# Patient Record
Sex: Female | Born: 1990 | Race: White | Hispanic: No | Marital: Married | Smoking: Never smoker
Health system: Southern US, Community
[De-identification: ages and names within clinical notes are randomized; demographics above are authoritative.]

## PROBLEM LIST (undated history)

## (undated) DIAGNOSIS — B977 Papillomavirus as the cause of diseases classified elsewhere: Secondary | ICD-10-CM

## (undated) DIAGNOSIS — O139 Gestational [pregnancy-induced] hypertension without significant proteinuria, unspecified trimester: Secondary | ICD-10-CM

## (undated) DIAGNOSIS — L309 Dermatitis, unspecified: Secondary | ICD-10-CM

## (undated) DIAGNOSIS — R87629 Unspecified abnormal cytological findings in specimens from vagina: Secondary | ICD-10-CM

## (undated) HISTORY — DX: Papillomavirus as the cause of diseases classified elsewhere: B97.7

## (undated) HISTORY — DX: Unspecified abnormal cytological findings in specimens from vagina: R87.629

## (undated) HISTORY — DX: Gestational (pregnancy-induced) hypertension without significant proteinuria, unspecified trimester: O13.9

---
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
## 2020-01-23 ENCOUNTER — Encounter: Payer: Self-pay | Admitting: Certified Nurse Midwife

## 2020-02-13 ENCOUNTER — Encounter: Payer: Self-pay | Admitting: Advanced Practice Midwife

## 2020-02-13 ENCOUNTER — Other Ambulatory Visit (HOSPITAL_COMMUNITY)
Admission: RE | Admit: 2020-02-13 | Discharge: 2020-02-13 | Disposition: A | Discharge status: Home / Self Care | Payer: 59 | Source: Ambulatory Visit | Attending: Advanced Practice Midwife | Admitting: Advanced Practice Midwife

## 2020-02-13 ENCOUNTER — Ambulatory Visit (INDEPENDENT_AMBULATORY_CARE_PROVIDER_SITE_OTHER): Payer: 59 | Admitting: Advanced Practice Midwife

## 2020-02-13 ENCOUNTER — Other Ambulatory Visit: Payer: Self-pay

## 2020-02-13 VITALS — BP 125/77 | HR 100 | Wt 208.0 lb

## 2020-02-13 DIAGNOSIS — Z3490 Encounter for supervision of normal pregnancy, unspecified, unspecified trimester: Secondary | ICD-10-CM

## 2020-02-13 DIAGNOSIS — O219 Vomiting of pregnancy, unspecified: Secondary | ICD-10-CM

## 2020-02-13 DIAGNOSIS — Z8759 Personal history of other complications of pregnancy, childbirth and the puerperium: Secondary | ICD-10-CM

## 2020-02-13 DIAGNOSIS — Z3A12 12 weeks gestation of pregnancy: Secondary | ICD-10-CM

## 2020-02-13 NOTE — Progress Notes (Signed)
Bedside U/S shows single IUP with FHT of 164 BPM and HC measures 7.51cm  GA is [redacted]w[redacted]d

## 2020-02-13 NOTE — Progress Notes (Signed)
Subjective:   Kathryn Mccoy is a 29 y.o. G2P1001 at [redacted]w[redacted]d by LMP being seen today for her first obstetrical visit.  Her obstetrical history is significant for HTN with first pregnancy, problems with epidural and postpartum hemorrhage and has Supervision of normal pregnancy on their problem list.. Patient does intend to breast feed. She is currently breastfeeding her 66 year old daughter.  Pregnancy history fully reviewed.  Patient reports nausea.  HISTORY: OB History  Gravida Para Term Preterm AB Living  2 1 1  0 0 1  SAB TAB Ectopic Multiple Live Births  0 0 0 0 1    # Outcome Date GA Lbr Len/2nd Weight Sex Delivery Anes PTL Lv  2 Current           1 Term 11/11/17 [redacted]w[redacted]d          Past Medical History:  Diagnosis Date  . Eczema   . Gestational hypertension   . HPV in female   . Vaginal Pap smear, abnormal    No past surgical history on file. Family History  Problem Relation Age of Onset  . Hypertension Father   . Breast cancer Mother 25   Social History   Tobacco Use  . Smoking status: Never Smoker  . Smokeless tobacco: Never Used  Vaping Use  . Vaping Use: Former  . Substances: Nicotine  Substance Use Topics  . Alcohol use: Never  . Drug use: Never   No Known Allergies Current Outpatient Medications on File Prior to Visit  Medication Sig Dispense Refill  . Prenatal Vit-Fe Fumarate-FA (PRENATAL VITAMINS PO) Take 1 tablet by mouth daily.     No current facility-administered medications on file prior to visit.    Indications for ASA therapy (per uptodate) One of the following: Previous pregnancy with preeclampsia, especially early onset and with an adverse outcome No Multifetal gestation No Chronic hypertension No Type 1 or 2 diabetes mellitus No Chronic kidney disease No Autoimmune disease (antiphospholipid syndrome, systemic lupus erythematosus) No   Two or more of the following: Nulliparity No Obesity (body mass index >30 kg/m2) Yes Family history of  preeclampsia in mother or sister No Age ?35 years No Sociodemographic characteristics (African American race, low socioeconomic level) No Personal risk factors (eg, previous pregnancy with low birth weight or small for gestational age infant, previous adverse pregnancy outcome [eg, stillbirth], interval >10 years between pregnancies) No   Indications for early 1 hour GTT (per uptodate)  BMI >25 (>23 in Asian women) AND one of the following  Gestational diabetes mellitus in a previous pregnancy No Glycated hemoglobin ?5.7 percent (39 mmol/mol), impaired glucose tolerance, or impaired fasting glucose on previous testing No First-degree relative with diabetes No High-risk race/ethnicity (eg, African American, Latino, Native American, 41 American, Pacific Islander) No History of cardiovascular disease No Hypertension or on therapy for hypertension No High-density lipoprotein cholesterol level <35 mg/dL ([place] mmol/L) and/or a triglyceride level >250 mg/dL (4.81 mmol/L) No Polycystic ovary syndrome No Physical inactivity No Other clinical condition associated with insulin resistance (eg, severe obesity, acanthosis nigricans) No Previous birth of an infant weighing ?4000 g No Previous stillbirth of unknown cause No Exam   Vitals:   02/13/20 1451  BP: 125/77  Pulse: 100  Weight: 208 lb (94.3 kg)   Fetal Heart Rate (bpm): 164  Uterus:     Pelvic Exam: Perineum: no hemorrhoids, normal perineum   Vulva: normal external genitalia, no lesions   Vagina:  normal mucosa, normal discharge  Cervix: no lesions and normal, pap smear done.    Adnexa: normal adnexa and no mass, fullness, tenderness   Bony Pelvis: average  System: General: well-developed, well-nourished female in no acute distress   Breast:  normal appearance, no masses or tenderness   Skin: normal coloration and turgor, no rashes   Neurologic: oriented, normal, negative, normal mood   Extremities: normal strength, tone, and  muscle mass, ROM of all joints is normal   HEENT PERRLA, extraocular movement intact and sclera clear, anicteric   Mouth/Teeth mucous membranes moist, pharynx normal without lesions and dental hygiene good   Neck supple and no masses   Cardiovascular: regular rate and rhythm   Respiratory:  no respiratory distress, normal breath sounds   Abdomen: soft, non-tender; bowel sounds normal; no masses,  no organomegaly     Assessment:   Pregnancy: G2P1001 Patient Active Problem List   Diagnosis Date Noted  . Supervision of normal pregnancy 02/13/2020     Plan:   1. [redacted] weeks gestation of pregnancy   2. Encounter for supervision of normal pregnancy, antepartum, unspecified gravidity --Anticipatory guidance about next visits/weeks of pregnancy given. --Next visit in 4 weeks in person for AFP  - Obstetric panel - HIV antibody (with reflex) - Hepatitis C Antibody - Culture, OB Urine - Hemoglobinopathy Evaluation - [place] bedside; Future - Genetic Screening - Babyscripts Schedule Optimization - GC/Chlamydia probe amp ([place])not at [facility] - [place] MFM OB COMP + 14 WK; Future  3. History of gestational hypertension --Start Baby ASA daily to prevent PEC - Comprehensive metabolic panel - Protein / creatinine ratio, urine  4. History of postpartum hemorrhage --Pt "lost as much blood as in a c-section" the pt reports she was told by her doctor. Records requested. --Discussed options to prevent including medications like TXA and Cytotec in addition to Pitocin given during and after delivery.   5. Nausea and vomiting during pregnancy prior to [redacted] weeks gestation --Mostly nausea, rare vomiting --Discussed dietary changes, offered medications, pt declined at this time --Will try B6 and let office know if she needs more.     Initial labs drawn. Continue prenatal vitamins. Discussed and offered genetic screening options, including Quad screen/AFP, NIPS testing, and option to decline testing.  Benefits/risks/alternatives reviewed. Pt aware that anatomy US is form of genetic screening with lower accuracy in detecting trisomies than blood work.  Pt chooses genetic screening today. NIPS: ordered. Ultrasound discussed; fetal anatomic survey: ordered. Problem list reviewed and updated. The nature of [facility] with multiple MDs and other Advanced Practice Providers was explained to patient; also emphasized that residents, students are part of our team. Routine obstetric precautions reviewed. Return in about 6 weeks (around 03/26/2020).   Sharen Counter, CNM 02/13/20 4:36 PM

## 2020-02-13 NOTE — Progress Notes (Signed)
Last pap 6 months ago WNL @ [facility]

## 2020-02-13 NOTE — Patient Instructions (Signed)
General Headache Without Cause A headache is pain or discomfort felt around the head or neck area. The specific cause of a headache may not be found. There are many causes and types of headaches. A few common ones are:  Tension headaches.  Migraine headaches.  Cluster headaches.  Chronic daily headaches. Follow these instructions at home: Watch your condition for any changes. Let your health care provider know about them. Take these steps to help with your condition: Managing pain      Take over-the-counter and prescription medicines only as told by your health care provider.  Lie down in a dark, quiet room when you have a headache.  If directed, put ice on your head and neck area: ? Put ice in a plastic bag. ? Place a towel between your skin and the bag. ? Leave the ice on for 20 minutes, 2-3 times per day.  If directed, apply heat to the affected area. Use the heat source that your health care provider recommends, such as a moist heat pack or a heating pad. ? Place a towel between your skin and the heat source. ? Leave the heat on for 20-30 minutes. ? Remove the heat if your skin turns bright red. This is especially important if you are unable to feel pain, heat, or cold. You may have a greater risk of getting burned.  Keep lights dim if bright lights bother you or make your headaches worse. Eating and drinking  Eat meals on a regular schedule.  If you drink alcohol: ? Limit how much you use to:  0-1 drink a day for women.  0-2 drinks a day for men. ? Be aware of how much alcohol is in your drink. In the [place] one drink equals one 12 oz bottle of beer (355 mL), one 5 oz glass of wine (148 mL), or one 1 oz glass of hard liquor (44 mL).  Stop drinking caffeine, or decrease the amount of caffeine you drink. General instructions   Keep a headache journal to help find out what may trigger your headaches. For example, write down: ? What you eat and drink. ? How much  sleep you get. ? Any change to your diet or medicines.  Try massage or other relaxation techniques.  Limit stress.  Sit up straight, and do not tense your muscles.  Do not use any products that contain nicotine or tobacco, such as cigarettes, e-cigarettes, and chewing tobacco. If you need help quitting, ask your health care provider.  Exercise regularly as told by your health care provider.  Sleep on a regular schedule. Get 7-9 hours of sleep each night, or the amount recommended by your health care provider.  Keep all follow-up visits as told by your health care provider. This is important. Contact a health care provider if:  Your symptoms are not helped by medicine.  You have a headache that is different from the usual headache.  You have nausea or you vomit.  You have a fever. Get help right away if:  Your headache becomes severe quickly.  Your headache gets worse after moderate to intense physical activity.  You have repeated vomiting.  You have a stiff neck.  You have a loss of vision.  You have problems with speech.  You have pain in the eye or ear.  You have muscular weakness or loss of muscle control.  You lose your balance or have trouble walking.  You feel faint or pass out.  You have confusion.    You have a seizure. Summary  A headache is pain or discomfort felt around the head or neck area.  There are many causes and types of headaches. In some cases, the cause may not be found.  Keep a headache journal to help find out what may trigger your headaches. Watch your condition for any changes. Let your health care provider know about them.  Contact a health care provider if you have a headache that is different from the usual headache, or if your symptoms are not helped by medicine.  Get help right away if your headache becomes severe, you vomit, you have a loss of vision, you lose your balance, or you have a seizure. This information is not  intended to replace advice given to you by your health care provider. Make sure you discuss any questions you have with your health care provider. Document Revised: 12/06/2017 Document Reviewed: 12/06/2017 Elsevier Patient Education  2020 Elsevier Inc.  

## 2020-02-14 LAB — GC/CHLAMYDIA PROBE AMP (~~LOC~~) NOT AT ARMC
Chlamydia: NEGATIVE
Comment: NEGATIVE
Comment: NORMAL
Neisseria Gonorrhea: NEGATIVE

## 2020-02-15 LAB — OBSTETRIC PANEL
Absolute Monocytes: 353 cells/uL (ref 200–950)
Antibody Screen: NOT DETECTED
Basophils Absolute: 30 cells/uL (ref 0–200)
Basophils Relative: 0.4 %
Eosinophils Absolute: 83 cells/uL (ref 15–500)
Eosinophils Relative: 1.1 %
HCT: 37.6 % (ref 35.0–45.0)
Hemoglobin: 12.5 g/dL (ref 11.7–15.5)
Hepatitis B Surface Ag: NONREACTIVE
Lymphs Abs: 1725 cells/uL (ref 850–3900)
MCH: 28.5 pg (ref 27.0–33.0)
MCHC: 33.2 g/dL (ref 32.0–36.0)
MCV: 85.6 fL (ref 80.0–100.0)
MPV: 10.9 fL (ref 7.5–12.5)
Monocytes Relative: 4.7 %
Neutro Abs: 5310 cells/uL (ref 1500–7800)
Neutrophils Relative %: 70.8 %
Platelets: 246 10*3/uL (ref 140–400)
RBC: 4.39 10*6/uL (ref 3.80–5.10)
RDW: 13.7 % (ref 11.0–15.0)
RPR Ser Ql: NONREACTIVE
Rubella: 0.96 Index — ABNORMAL LOW
Total Lymphocyte: 23 %
WBC: 7.5 10*3/uL (ref 3.8–10.8)

## 2020-02-15 LAB — PROTEIN / CREATININE RATIO, URINE
Creatinine, Urine: 74 mg/dL (ref 20–275)
Protein/Creat Ratio: 108 mg/g creat (ref 21–161)
Protein/Creatinine Ratio: 0.108 mg/mg creat (ref 0.021–0.16)
Total Protein, Urine: 8 mg/dL (ref 5–24)

## 2020-02-15 LAB — COMPREHENSIVE METABOLIC PANEL
AG Ratio: 1.3 (calc) (ref 1.0–2.5)
ALT: 13 U/L (ref 6–29)
AST: 14 U/L (ref 10–30)
Albumin: 4 g/dL (ref 3.6–5.1)
Alkaline phosphatase (APISO): 66 U/L (ref 31–125)
BUN/Creatinine Ratio: 12 (calc) (ref 6–22)
BUN: 6 mg/dL — ABNORMAL LOW (ref 7–25)
CO2: 21 mmol/L (ref 20–32)
Calcium: 9.5 mg/dL (ref 8.6–10.2)
Chloride: 102 mmol/L (ref 98–110)
Creat: 0.49 mg/dL — ABNORMAL LOW (ref 0.50–1.10)
Globulin: 3.1 g/dL (calc) (ref 1.9–3.7)
Glucose, Bld: 82 mg/dL (ref 65–99)
Potassium: 3.7 mmol/L (ref 3.5–5.3)
Sodium: 135 mmol/L (ref 135–146)
Total Bilirubin: 0.3 mg/dL (ref 0.2–1.2)
Total Protein: 7.1 g/dL (ref 6.1–8.1)

## 2020-02-15 LAB — HEMOGLOBINOPATHY EVALUATION
Fetal Hemoglobin Testing: <1 % (ref 0.0–1.9)
HCT: 38.4 % (ref 35.0–45.0)
Hemoglobin A2 - HGBRFX: 2.5 % (ref 2.2–3.2)
Hemoglobin: 12.3 g/dL (ref 11.7–15.5)
Hgb A: 97.5 % (ref >96.0)
MCH: 27.5 pg (ref 27.0–33.0)
MCV: 85.9 fL (ref 80.0–100.0)
RBC: 4.47 10*6/uL (ref 3.80–5.10)
RDW: 13.8 % (ref 11.0–15.0)

## 2020-02-15 LAB — URINE CULTURE, OB REFLEX: Organism ID, Bacteria: NO GROWTH

## 2020-02-15 LAB — CULTURE, OB URINE

## 2020-02-15 LAB — HIV ANTIBODY (ROUTINE TESTING W REFLEX): HIV 1&2 Ab, 4th Generation: NONREACTIVE

## 2020-02-15 LAB — HEPATITIS C ANTIBODY
Hepatitis C Ab: NONREACTIVE
SIGNAL TO CUT-OFF: 0.06 (ref <1.00)

## 2020-02-26 ENCOUNTER — Encounter: Payer: Self-pay | Admitting: *Deleted

## 2020-02-26 DIAGNOSIS — Z3490 Encounter for supervision of normal pregnancy, unspecified, unspecified trimester: Secondary | ICD-10-CM

## 2020-02-27 ENCOUNTER — Encounter: Payer: Self-pay | Admitting: *Deleted

## 2020-02-27 DIAGNOSIS — Z3490 Encounter for supervision of normal pregnancy, unspecified, unspecified trimester: Secondary | ICD-10-CM

## 2020-03-12 ENCOUNTER — Telehealth (INDEPENDENT_AMBULATORY_CARE_PROVIDER_SITE_OTHER): Payer: 59 | Admitting: Advanced Practice Midwife

## 2020-03-12 VITALS — BP 113/71 | HR 96 | Wt 209.0 lb

## 2020-03-12 DIAGNOSIS — Z3492 Encounter for supervision of normal pregnancy, unspecified, second trimester: Secondary | ICD-10-CM

## 2020-03-12 DIAGNOSIS — Z3490 Encounter for supervision of normal pregnancy, unspecified, unspecified trimester: Secondary | ICD-10-CM

## 2020-03-12 DIAGNOSIS — Z3A16 16 weeks gestation of pregnancy: Secondary | ICD-10-CM

## 2020-03-12 NOTE — Progress Notes (Signed)
   OBSTETRICS PRENATAL VIRTUAL VISIT ENCOUNTER NOTE  Provider location: Center for [facility] [place]   I connected with [name] on 03/12/20 at  9:50 AM EDT by MyChart Video Encounter at home and verified that I am speaking with the correct person using two identifiers.   I discussed the limitations, risks, security and privacy concerns of performing an evaluation and management service virtually and the availability of in person appointments. I also discussed with the patient that there may be a patient responsible charge related to this service. The patient expressed understanding and agreed to proceed. Subjective:  Kathryn Mccoy is a 29 y.o. G2P1001 at [redacted]w[redacted]d being seen today for ongoing prenatal care.  She is currently monitored for the following issues for this low-risk pregnancy and has Supervision of normal pregnancy; History of postpartum hemorrhage; and History of gestational hypertension on their problem list.  Patient reports no complaints.  Contractions: Not present. Vag. Bleeding: None.  Movement: Present. Denies any leaking of fluid.   The following portions of the patient's history were reviewed and updated as appropriate: allergies, current medications, past family history, past medical history, past social history, past surgical history and problem list.   Objective:   Vitals:   03/12/20 0850  BP: 113/71  Pulse: 96  Weight: 209 lb (94.8 kg)    Fetal Status:     Movement: Present     General:  Alert, oriented and cooperative. Patient is in no acute distress.  Respiratory: Normal respiratory effort, no problems with respiration noted  Mental Status: Normal mood and affect. Normal behavior. Normal judgment and thought content.  Rest of physical exam deferred due to type of encounter  Imaging: No results found.  Assessment and Plan:  Pregnancy: G2P1001 at [redacted]w[redacted]d 1. Encounter for supervision of normal pregnancy, antepartum, unspecified  gravidity --Anticipatory guidance about next visits/weeks of pregnancy given. --Next visit in 4 weeks, in office for AFP --Anatomy US next month  2. [redacted] weeks gestation of pregnancy   Preterm labor symptoms and general obstetric precautions including but not limited to vaginal bleeding, contractions, leaking of fluid and fetal movement were reviewed in detail with the patient. I discussed the assessment and treatment plan with the patient. The patient was provided an opportunity to ask questions and all were answered. The patient agreed with the plan and demonstrated an understanding of the instructions. The patient was advised to call back or seek an in-person office evaluation/go to [facility] at [facility] for any urgent or concerning symptoms. Please refer to After Visit Summary for other counseling recommendations.   I provided 7 minutes of face-to-face time during this encounter.  Return in about 4 weeks (around 04/09/2020).  Future Appointments  Date Time Provider Department Center  04/02/2020  2:45 PM [facility] US4 [facility]    Sharen Counter, CNM Center for [employer], [facility]

## 2020-04-02 ENCOUNTER — Ambulatory Visit: Payer: 59 | Attending: Advanced Practice Midwife

## 2020-04-02 ENCOUNTER — Other Ambulatory Visit: Payer: Self-pay

## 2020-04-02 DIAGNOSIS — Z3490 Encounter for supervision of normal pregnancy, unspecified, unspecified trimester: Secondary | ICD-10-CM | POA: Insufficient documentation

## 2020-04-03 ENCOUNTER — Other Ambulatory Visit: Payer: Self-pay | Admitting: *Deleted

## 2020-04-03 DIAGNOSIS — Z8759 Personal history of other complications of pregnancy, childbirth and the puerperium: Secondary | ICD-10-CM

## 2020-04-09 ENCOUNTER — Ambulatory Visit (INDEPENDENT_AMBULATORY_CARE_PROVIDER_SITE_OTHER): Payer: 59 | Admitting: Advanced Practice Midwife

## 2020-04-09 ENCOUNTER — Other Ambulatory Visit: Payer: Self-pay

## 2020-04-09 VITALS — BP 117/71 | HR 95 | Wt 211.0 lb

## 2020-04-09 DIAGNOSIS — Z3A20 20 weeks gestation of pregnancy: Secondary | ICD-10-CM

## 2020-04-09 DIAGNOSIS — O43199 Other malformation of placenta, unspecified trimester: Secondary | ICD-10-CM

## 2020-04-09 DIAGNOSIS — Z3490 Encounter for supervision of normal pregnancy, unspecified, unspecified trimester: Secondary | ICD-10-CM

## 2020-04-09 NOTE — Progress Notes (Signed)
   PRENATAL VISIT NOTE  Subjective:  Kathryn Mccoy is a 29 y.o. G2P1001 at [redacted]w[redacted]d being seen today for ongoing prenatal care.  She is currently monitored for the following issues for this low-risk pregnancy and has Supervision of normal pregnancy; History of postpartum hemorrhage; History of gestational hypertension; and Marginal insertion of umbilical cord affecting management of mother on their problem list.  Patient reports no complaints.  Contractions: Not present. Vag. Bleeding: None.  Movement: Present. Denies leaking of fluid.   The following portions of the patient's history were reviewed and updated as appropriate: allergies, current medications, past family history, past medical history, past social history, past surgical history and problem list.   Objective:   Vitals:   04/09/20 1544  BP: 117/71  Pulse: 95  Weight: 211 lb (95.7 kg)    Fetal Status: Fetal Heart Rate (bpm): 146   Movement: Present     General:  Alert, oriented and cooperative. Patient is in no acute distress.  Skin: Skin is warm and dry. No rash noted.   Cardiovascular: Normal heart rate noted  Respiratory: Normal respiratory effort, no problems with respiration noted  Abdomen: Soft, gravid, appropriate for gestational age.  Pain/Pressure: Absent     Pelvic: Cervical exam deferred        Extremities: Normal range of motion.  Edema: None  Mental Status: Normal mood and affect. Normal behavior. Normal judgment and thought content.   Assessment and Plan:  Pregnancy: G2P1001 at [redacted]w[redacted]d 1. [redacted] weeks gestation of pregnancy  - Alpha fetoprotein, maternal  2. Marginal insertion of umbilical cord affecting management of mother --Serial growth [place]  3. Encounter for supervision of normal pregnancy, antepartum, unspecified gravidity --Anticipatory guidance about next visits/weeks of pregnancy given. --Next visit in 4 weeks virtual, then in 8 weeks for GTT  Preterm labor symptoms and general obstetric precautions  including but not limited to vaginal bleeding, contractions, leaking of fluid and fetal movement were reviewed in detail with the patient. Please refer to After Visit Summary for other counseling recommendations.   No follow-ups on file.  Future Appointments  Date Time Provider Department Center  04/30/2020  3:30 PM [facility] NURSE [facility]  04/30/2020  3:45 PM [facility] US4 [facility]    Sharen Counter, CNM

## 2020-04-09 NOTE — Patient Instructions (Signed)

## 2020-04-10 LAB — ALPHA FETOPROTEIN, MATERNAL
AFP MoM: 0.9
AFP, Serum: 47.5 ng/mL
Calc'd Gestational Age: 20.9 weeks
Maternal Wt: 209 [lb_av]
Risk for ONTD: 1
Twins-AFP: 1

## 2020-04-30 ENCOUNTER — Encounter: Payer: Self-pay | Admitting: *Deleted

## 2020-04-30 ENCOUNTER — Telehealth: Payer: Self-pay

## 2020-04-30 ENCOUNTER — Other Ambulatory Visit: Payer: Self-pay

## 2020-04-30 ENCOUNTER — Ambulatory Visit: Payer: 59 | Attending: Obstetrics and Gynecology

## 2020-04-30 ENCOUNTER — Ambulatory Visit: Payer: 59 | Admitting: *Deleted

## 2020-04-30 DIAGNOSIS — E669 Obesity, unspecified: Secondary | ICD-10-CM | POA: Diagnosis not present

## 2020-04-30 DIAGNOSIS — O99212 Obesity complicating pregnancy, second trimester: Secondary | ICD-10-CM | POA: Diagnosis not present

## 2020-04-30 DIAGNOSIS — O09292 Supervision of pregnancy with other poor reproductive or obstetric history, second trimester: Secondary | ICD-10-CM

## 2020-04-30 DIAGNOSIS — Z8759 Personal history of other complications of pregnancy, childbirth and the puerperium: Secondary | ICD-10-CM | POA: Insufficient documentation

## 2020-04-30 DIAGNOSIS — O43199 Other malformation of placenta, unspecified trimester: Secondary | ICD-10-CM

## 2020-04-30 DIAGNOSIS — Z363 Encounter for antenatal screening for malformations: Secondary | ICD-10-CM | POA: Diagnosis not present

## 2020-04-30 DIAGNOSIS — Z3A23 23 weeks gestation of pregnancy: Secondary | ICD-10-CM

## 2020-04-30 NOTE — Telephone Encounter (Signed)
Per Dr. Parke Poisson, I changed this patients appt from 4wks out to 3wks out. Spoke with the patient and gave her a new appointment time.

## 2020-05-01 ENCOUNTER — Other Ambulatory Visit: Payer: Self-pay | Admitting: *Deleted

## 2020-05-07 ENCOUNTER — Telehealth (INDEPENDENT_AMBULATORY_CARE_PROVIDER_SITE_OTHER): Payer: 59 | Admitting: Advanced Practice Midwife

## 2020-05-07 ENCOUNTER — Other Ambulatory Visit: Payer: Self-pay

## 2020-05-07 DIAGNOSIS — O98519 Other viral diseases complicating pregnancy, unspecified trimester: Secondary | ICD-10-CM

## 2020-05-07 DIAGNOSIS — O365920 Maternal care for other known or suspected poor fetal growth, second trimester, not applicable or unspecified: Secondary | ICD-10-CM | POA: Insufficient documentation

## 2020-05-07 DIAGNOSIS — U071 Other viral diseases complicating pregnancy, unspecified trimester: Secondary | ICD-10-CM

## 2020-05-07 DIAGNOSIS — Z3A24 24 weeks gestation of pregnancy: Secondary | ICD-10-CM

## 2020-05-07 DIAGNOSIS — O43199 Other malformation of placenta, unspecified trimester: Secondary | ICD-10-CM

## 2020-05-07 DIAGNOSIS — Z3490 Encounter for supervision of normal pregnancy, unspecified, unspecified trimester: Secondary | ICD-10-CM

## 2020-05-07 DIAGNOSIS — O43192 Other malformation of placenta, second trimester: Secondary | ICD-10-CM

## 2020-05-07 DIAGNOSIS — O98512 Other viral diseases complicating pregnancy, second trimester: Secondary | ICD-10-CM

## 2020-05-07 HISTORY — DX: Other viral diseases complicating pregnancy, unspecified trimester: U07.1

## 2020-05-07 HISTORY — DX: Maternal care for other known or suspected poor fetal growth, second trimester, not applicable or unspecified: O36.5920

## 2020-05-07 HISTORY — DX: COVID-19: O98.519

## 2020-05-07 NOTE — Progress Notes (Signed)
   TELEHEALTH OBSTETRICS VISIT ENCOUNTER NOTE  I connected with Kathryn Mccoy on 05/07/20 at  9:30 AM EST by telephone at home and verified that I am speaking with the correct person using two identifiers. The patient was unable to connect to MyChart for video visit so proceeded with visit by telephone.  I discussed the limitations, risks, security and privacy concerns of performing an evaluation and management service by telephone and the availability of in person appointments. I also discussed with the patient that there may be a patient responsible charge related to this service. The patient expressed understanding and agreed to proceed.  Subjective:  Kathryn Mccoy is a 29 y.o. G2P1001 at [redacted]w[redacted]d being followed for ongoing prenatal care.  She is currently monitored for the following issues for this low-risk pregnancy and has Supervision of normal pregnancy; History of postpartum hemorrhage; History of gestational hypertension; and Marginal insertion of umbilical cord affecting management of mother on their problem list.  Patient reports no complaints. Reports fetal movement. Denies any contractions, bleeding or leaking of fluid.   The following portions of the patient's history were reviewed and updated as appropriate: allergies, current medications, past family history, past medical history, past social history, past surgical history and problem list.   Objective:   General:  Alert, oriented and cooperative.   Mental Status: Normal mood and affect perceived. Normal judgment and thought content.  Rest of physical exam deferred due to type of encounter  Assessment and Plan:  Pregnancy: G2P1001 at [redacted]w[redacted]d 1. [redacted] weeks gestation of pregnancy   2. Marginal insertion of umbilical cord affecting management of mother --Growth  [place] Q 4 weeks, see below  3. Encounter for supervision of normal pregnancy, antepartum, unspecified gravidity --Pt reports good fetal movement, denies cramping, LOF, or  vaginal bleeding --Anticipatory guidance about next visits/weeks of pregnancy given. --Next visit in office in 4 weeks for GTT  4. Poor fetal growth affecting management of mother in second trimester, single or unspecified fetus --[place] on 11/30 showed EFW 7%tile, follow up in 3 weeks  5. COVID-19 affecting pregnancy, antepartum --Pt had COVID in early pregnancy, lost 5 lbs but is now back to weight before COVID and no residual COVID symptoms.  --Growth US shows SGA so will continue growth US/dopplers with MFM --Discussed possible need for IOL at 39 weeks or sooner depending on growth and dopplers. Pt states understanding.   Preterm labor symptoms and general obstetric precautions including but not limited to vaginal bleeding, contractions, leaking of fluid and fetal movement were reviewed in detail with the patient.  I discussed the assessment and treatment plan with the patient. The patient was provided an opportunity to ask questions and all were answered. The patient agreed with the plan and demonstrated an understanding of the instructions. The patient was advised to call back or seek an in-person office evaluation/go to MAU at [facility] for any urgent or concerning symptoms. Please refer to After Visit Summary for other counseling recommendations.   I provided 10 minutes of non-face-to-face time during this encounter.  No follow-ups on file.  Future Appointments  Date Time Provider Department Center  05/22/2020  8:30 AM [facility] NURSE [facility]  05/22/2020  8:45 AM [facility] US4 [facility]    Sharen Counter, CNM Center for [employer], [facility]

## 2020-05-22 ENCOUNTER — Ambulatory Visit: Payer: 59 | Admitting: *Deleted

## 2020-05-22 ENCOUNTER — Other Ambulatory Visit: Payer: Self-pay | Admitting: *Deleted

## 2020-05-22 ENCOUNTER — Ambulatory Visit: Payer: 59 | Attending: Obstetrics and Gynecology

## 2020-05-22 ENCOUNTER — Other Ambulatory Visit: Payer: Self-pay

## 2020-05-22 ENCOUNTER — Encounter: Payer: Self-pay | Admitting: *Deleted

## 2020-05-22 DIAGNOSIS — O43199 Other malformation of placenta, unspecified trimester: Secondary | ICD-10-CM | POA: Insufficient documentation

## 2020-05-22 DIAGNOSIS — E669 Obesity, unspecified: Secondary | ICD-10-CM

## 2020-05-22 DIAGNOSIS — Z3A27 27 weeks gestation of pregnancy: Secondary | ICD-10-CM

## 2020-05-22 DIAGNOSIS — O2692 Pregnancy related conditions, unspecified, second trimester: Secondary | ICD-10-CM

## 2020-05-22 DIAGNOSIS — O09292 Supervision of pregnancy with other poor reproductive or obstetric history, second trimester: Secondary | ICD-10-CM | POA: Diagnosis not present

## 2020-05-22 DIAGNOSIS — O99212 Obesity complicating pregnancy, second trimester: Secondary | ICD-10-CM

## 2020-05-22 DIAGNOSIS — O289 Unspecified abnormal findings on antenatal screening of mother: Secondary | ICD-10-CM

## 2020-05-22 DIAGNOSIS — Z8759 Personal history of other complications of pregnancy, childbirth and the puerperium: Secondary | ICD-10-CM

## 2020-05-22 DIAGNOSIS — Z363 Encounter for antenatal screening for malformations: Secondary | ICD-10-CM

## 2020-05-28 ENCOUNTER — Ambulatory Visit: Payer: 59

## 2020-06-01 NOTE — L&D Delivery Note (Signed)
Delivery Note Called to room and patient complete and pushing. At 5:32 AM a viable female was delivered via Vaginal, Spontaneous (Presentation: Right Occiput Anterior).  APGAR:1,3,5; weight 7 lb 5.5 oz (3330 g).  Tight nuchal cord x1 noted at delivery, delivered through. Placenta status: Pathology;Manual removal, Intact.  Cord: 3 vessels with the following complications: velamentous insertion.  Cord pH: pending, arterial and venous gases sent.  After delivery cord avulsion occurred with gentle cord traction. Cervix was closing so at 15 minute mark decision made to manually extract placenta. Dr. Vergie Living called to bedside to ensure all products extracted, ultrasound revealed thin endometrial stripe. 2g ancef given for prophylaxis.  Anesthesia: Epidural Episiotomy: None Lacerations: Left Sulcus Suture Repair: 3.0 vicryl Est. Blood Loss (mL): 300  Mom to postpartum.  Baby to NICU.  Kathryn Mccoy 08/28/2020, 6:12 AM

## 2020-06-04 ENCOUNTER — Other Ambulatory Visit: Payer: Self-pay

## 2020-06-04 ENCOUNTER — Ambulatory Visit (INDEPENDENT_AMBULATORY_CARE_PROVIDER_SITE_OTHER): Payer: 59 | Admitting: Advanced Practice Midwife

## 2020-06-04 VITALS — BP 121/84 | HR 109 | Wt 216.0 lb

## 2020-06-04 DIAGNOSIS — O365920 Maternal care for other known or suspected poor fetal growth, second trimester, not applicable or unspecified: Secondary | ICD-10-CM

## 2020-06-04 DIAGNOSIS — Z3A28 28 weeks gestation of pregnancy: Secondary | ICD-10-CM

## 2020-06-04 DIAGNOSIS — O98519 Other viral diseases complicating pregnancy, unspecified trimester: Secondary | ICD-10-CM

## 2020-06-04 DIAGNOSIS — O26893 Other specified pregnancy related conditions, third trimester: Secondary | ICD-10-CM

## 2020-06-04 DIAGNOSIS — O43199 Other malformation of placenta, unspecified trimester: Secondary | ICD-10-CM

## 2020-06-04 DIAGNOSIS — R12 Heartburn: Secondary | ICD-10-CM

## 2020-06-04 DIAGNOSIS — U071 COVID-19: Secondary | ICD-10-CM

## 2020-06-04 DIAGNOSIS — Z3480 Encounter for supervision of other normal pregnancy, unspecified trimester: Secondary | ICD-10-CM

## 2020-06-04 LAB — CBC: MCH: 29.7 pg (ref 27.0–33.0)

## 2020-06-04 NOTE — Progress Notes (Signed)
   PRENATAL VISIT NOTE  Subjective:  Kathryn Mccoy is a 30 y.o. G2P1001 at [redacted]w[redacted]d being seen today for ongoing prenatal care.  She is currently monitored for the following issues for this low-risk pregnancy and has Supervision of normal pregnancy; History of postpartum hemorrhage; History of gestational hypertension; Marginal insertion of umbilical cord affecting management of mother; COVID-19 affecting pregnancy, antepartum; and Poor fetal growth affecting management of mother in second trimester on their problem list.  Patient reports no complaints.  Contractions: Not present. Vag. Bleeding: None.  Movement: Present. Denies leaking of fluid.   The following portions of the patient's history were reviewed and updated as appropriate: allergies, current medications, past family history, past medical history, past social history, past surgical history and problem list.   Objective:   Vitals:   06/04/20 0820  BP: 121/84  Pulse: (!) 109  Weight: 216 lb (98 kg)    Fetal Status: Fetal Heart Rate (bpm): 145   Movement: Present     General:  Alert, oriented and cooperative. Patient is in no acute distress.  Skin: Skin is warm and dry. No rash noted.   Cardiovascular: Normal heart rate noted  Respiratory: Normal respiratory effort, no problems with respiration noted  Abdomen: Soft, gravid, appropriate for gestational age.  Pain/Pressure: Absent     Pelvic: Cervical exam deferred        Extremities: Normal range of motion.  Edema: None  Mental Status: Normal mood and affect. Normal behavior. Normal judgment and thought content.   Assessment and Plan:  Pregnancy: G2P1001 at [redacted]w[redacted]d  1. [redacted] weeks gestation of pregnancy  - 2Hr GTT w/ 1 Hr Carpenter 75 g - HIV antibody (with reflex) - CBC - RPR  2. Heartburn during pregnancy in third trimester --Pt encouraged to try Pepcid 20 mg BID or at night when symptoms usually occur.  3. Supervision of other normal pregnancy, antepartum --Anticipatory  guidance about next visits/weeks of pregnancy given.  4. Poor fetal growth affecting management of mother in second trimester, single or unspecified fetus --IUGR initially diagnosed with IUGR with EFW 7%tile on 11/30 but last [place] on 12/22 with EFW 11%tile --Dopplers with elevated S/D ratio, no reverse or absent flow on 12/22 so follow up in 2 weeks --Pt with questions about delivery.  Discussed with pt that usual delivery with IUGR is 39 weeks unless Dopplers are abnormal.    5. COVID-19 affecting pregnancy, antepartum --In early pregnancy causing some weight loss  6. Marginal insertion of umbilical cord affecting management of mother --Growth  [place], see growth above  Preterm labor symptoms and general obstetric precautions including but not limited to vaginal bleeding, contractions, leaking of fluid and fetal movement were reviewed in detail with the patient. Please refer to After Visit Summary for other counseling recommendations.   No follow-ups on file.  Future Appointments  Date Time Provider Department Center  06/07/2020  9:15 AM [facility] NURSE [facility]  06/07/2020  9:30 AM [facility] US3 [facility]  06/13/2020  1:30 PM [facility] NURSE [facility]  06/13/2020  1:45 PM [facility] US4 [facility]  06/18/2020  9:20 AM Leftwich-Kirby, Wilmer Floor, CNM [facility]    Sharen Counter, CNM

## 2020-06-05 LAB — CBC
HCT: 33.4 % — ABNORMAL LOW (ref 35.0–45.0)
Hemoglobin: 11.5 g/dL — ABNORMAL LOW (ref 11.7–15.5)
MCHC: 34.4 g/dL (ref 32.0–36.0)
MCV: 86.3 fL (ref 80.0–100.0)
MPV: 11.5 fL (ref 7.5–12.5)
Platelets: 229 10*3/uL (ref 140–400)
RBC: 3.87 10*6/uL (ref 3.80–5.10)
RDW: 13.6 % (ref 11.0–15.0)
WBC: 8.5 10*3/uL (ref 3.8–10.8)

## 2020-06-05 LAB — 2HR GTT W 1 HR, CARPENTER, 75 G
Glucose, 1 Hr, Gest: 148 mg/dL (ref 65–179)
Glucose, 2 Hr, Gest: 131 mg/dL (ref 65–152)
Glucose, Fasting, Gest: 72 mg/dL (ref 65–91)

## 2020-06-05 LAB — HIV ANTIBODY (ROUTINE TESTING W REFLEX): HIV 1&2 Ab, 4th Generation: NONREACTIVE

## 2020-06-05 LAB — RPR: RPR Ser Ql: NONREACTIVE

## 2020-06-07 ENCOUNTER — Other Ambulatory Visit: Payer: Self-pay | Admitting: *Deleted

## 2020-06-07 ENCOUNTER — Ambulatory Visit: Payer: 59 | Attending: Obstetrics and Gynecology

## 2020-06-07 ENCOUNTER — Encounter: Payer: Self-pay | Admitting: *Deleted

## 2020-06-07 ENCOUNTER — Other Ambulatory Visit: Payer: Self-pay

## 2020-06-07 ENCOUNTER — Ambulatory Visit: Payer: 59 | Admitting: *Deleted

## 2020-06-07 DIAGNOSIS — O2693 Pregnancy related conditions, unspecified, third trimester: Secondary | ICD-10-CM | POA: Diagnosis not present

## 2020-06-07 DIAGNOSIS — O321XX0 Maternal care for breech presentation, not applicable or unspecified: Secondary | ICD-10-CM | POA: Diagnosis not present

## 2020-06-07 DIAGNOSIS — Z8759 Personal history of other complications of pregnancy, childbirth and the puerperium: Secondary | ICD-10-CM | POA: Diagnosis not present

## 2020-06-07 DIAGNOSIS — O99212 Obesity complicating pregnancy, second trimester: Secondary | ICD-10-CM

## 2020-06-07 DIAGNOSIS — E669 Obesity, unspecified: Secondary | ICD-10-CM

## 2020-06-07 DIAGNOSIS — O365930 Maternal care for other known or suspected poor fetal growth, third trimester, not applicable or unspecified: Secondary | ICD-10-CM

## 2020-06-07 DIAGNOSIS — Z362 Encounter for other antenatal screening follow-up: Secondary | ICD-10-CM

## 2020-06-07 DIAGNOSIS — O43199 Other malformation of placenta, unspecified trimester: Secondary | ICD-10-CM | POA: Insufficient documentation

## 2020-06-12 ENCOUNTER — Telehealth: Payer: Self-pay | Admitting: *Deleted

## 2020-06-12 MED ORDER — PROMETHAZINE HCL 25 MG PO TABS: 25.0000 mg | ORAL_TABLET | Freq: Four times a day (QID) | ORAL | 1 refills | Status: DC | PRN

## 2020-06-12 NOTE — Telephone Encounter (Signed)
Pt called stating that was having Vomiting and Diarrhea since yesterday.  She denies any fever and testing negative for Covid in home test today.  I have recommended the BRAT diet and Immodium or Kaopectate for the diarrhea.  Per protocol Phenergan 25 mg tablets sent to [facility] for her nausea.  Explained that this may be a virus and trying to keep her out of the MAU if possible.  Pt to f/u with [place] tomorrow with update on symptoms.  She states she has appt with [facility] tomorrow.  I told her she should call them if she is still symptomatic and see if she needs to R/S appt.

## 2020-06-13 ENCOUNTER — Ambulatory Visit: Payer: 59 | Admitting: *Deleted

## 2020-06-13 ENCOUNTER — Other Ambulatory Visit (HOSPITAL_COMMUNITY): Payer: Self-pay | Admitting: Obstetrics and Gynecology

## 2020-06-13 ENCOUNTER — Ambulatory Visit: Payer: 59 | Attending: Obstetrics and Gynecology

## 2020-06-13 ENCOUNTER — Other Ambulatory Visit: Payer: Self-pay

## 2020-06-13 ENCOUNTER — Encounter: Payer: Self-pay | Admitting: *Deleted

## 2020-06-13 DIAGNOSIS — O365930 Maternal care for other known or suspected poor fetal growth, third trimester, not applicable or unspecified: Secondary | ICD-10-CM

## 2020-06-13 DIAGNOSIS — Z3A30 30 weeks gestation of pregnancy: Secondary | ICD-10-CM

## 2020-06-13 DIAGNOSIS — O43199 Other malformation of placenta, unspecified trimester: Secondary | ICD-10-CM | POA: Insufficient documentation

## 2020-06-13 DIAGNOSIS — Z8759 Personal history of other complications of pregnancy, childbirth and the puerperium: Secondary | ICD-10-CM | POA: Diagnosis present

## 2020-06-13 DIAGNOSIS — O99213 Obesity complicating pregnancy, third trimester: Secondary | ICD-10-CM

## 2020-06-13 DIAGNOSIS — E669 Obesity, unspecified: Secondary | ICD-10-CM | POA: Diagnosis not present

## 2020-06-13 DIAGNOSIS — O09293 Supervision of pregnancy with other poor reproductive or obstetric history, third trimester: Secondary | ICD-10-CM

## 2020-06-13 NOTE — Progress Notes (Signed)
C/o "N/V/Diarrhea starting on 06/09/20 and ending yesterday, taking phenergan and immodium, no cough, fever or loss of taste/smell, no known contact with + COVID persons."

## 2020-06-14 ENCOUNTER — Other Ambulatory Visit: Payer: Self-pay | Admitting: *Deleted

## 2020-06-14 DIAGNOSIS — Z8759 Personal history of other complications of pregnancy, childbirth and the puerperium: Secondary | ICD-10-CM

## 2020-06-18 ENCOUNTER — Encounter: Payer: 59 | Admitting: Advanced Practice Midwife

## 2020-06-18 ENCOUNTER — Telehealth (INDEPENDENT_AMBULATORY_CARE_PROVIDER_SITE_OTHER): Payer: 59 | Admitting: Advanced Practice Midwife

## 2020-06-18 VITALS — BP 108/78 | HR 110 | Wt 214.0 lb

## 2020-06-18 DIAGNOSIS — O365920 Maternal care for other known or suspected poor fetal growth, second trimester, not applicable or unspecified: Secondary | ICD-10-CM

## 2020-06-18 DIAGNOSIS — Z8616 Personal history of COVID-19: Secondary | ICD-10-CM

## 2020-06-18 DIAGNOSIS — Z3490 Encounter for supervision of normal pregnancy, unspecified, unspecified trimester: Secondary | ICD-10-CM

## 2020-06-18 DIAGNOSIS — Z3A30 30 weeks gestation of pregnancy: Secondary | ICD-10-CM

## 2020-06-18 DIAGNOSIS — O43193 Other malformation of placenta, third trimester: Secondary | ICD-10-CM

## 2020-06-18 DIAGNOSIS — Z8759 Personal history of other complications of pregnancy, childbirth and the puerperium: Secondary | ICD-10-CM

## 2020-06-18 DIAGNOSIS — O43199 Other malformation of placenta, unspecified trimester: Secondary | ICD-10-CM

## 2020-06-18 NOTE — Progress Notes (Signed)
OBSTETRICS PRENATAL VIRTUAL VISIT ENCOUNTER NOTE  Provider location: [facility] [place]   I connected with Kathryn Mccoy on 06/18/20 at  2:40 PM EST by MyChart Video Encounter at home and verified that I am speaking with the correct person using two identifiers.   I discussed the limitations, risks, security and privacy concerns of performing an evaluation and management service virtually and the availability of in person appointments. I also discussed with the patient that there may be a patient responsible charge related to this service. The patient expressed understanding and agreed to proceed. Subjective:  Kathryn Mccoy is a 30 y.o. G2P1001 at [redacted]w[redacted]d being seen today for ongoing prenatal care.  She is currently monitored for the following issues for this low-risk pregnancy and has Supervision of normal pregnancy; History of postpartum hemorrhage; History of gestational hypertension; Marginal insertion of umbilical cord affecting management of mother; COVID-19 affecting pregnancy, antepartum; and Poor fetal growth affecting management of mother in second trimester on their problem list.  Patient reports no complaints.  Contractions: Not present. Vag. Bleeding: None.  Movement: Present. Denies any leaking of fluid.   The following portions of the patient's history were reviewed and updated as appropriate: allergies, current medications, past family history, past medical history, past social history, past surgical history and problem list.   Objective:   Vitals:   06/18/20 1523  BP: 108/78  Pulse: (!) 110  Weight: 214 lb (97.1 kg)    Fetal Status:     Movement: Present     General:  Alert, oriented and cooperative. Patient is in no acute distress.  Respiratory: Normal respiratory effort, no problems with respiration noted  Mental Status: Normal mood and affect. Normal behavior. Normal judgment and thought content.  Rest of physical exam deferred due to type of  encounter  Imaging: [place] MFM OB FOLLOW UP  Result Date: 06/14/2020 ----------------------------------------------------------------------  OBSTETRICS REPORT                    (Corrected Final 06/14/2020 01:10 pm) ---------------------------------------------------------------------- Patient Info  ID #:       [id number]                          D.O.B.:  Jul 13, 1990 (29 yrs)  Name:       Kathryn Mccoy                   Visit Date: 06/13/2020 01:52 pm ---------------------------------------------------------------------- Performed By  Attending:        Noralee Space MD        Ref. Address:     [address] [place]  Performed By:     Jenel Lucks     [facility]  Referred By:      Everardo All ---------------------------------------------------------------------- Orders  #  Description                           Code        Ordered By  1  [place] MFM OB FOLLOW UP                   81191.47    Rosana Hoes  2  [place] MFM UA CORD DOPPLER                82956.21    RAVI [facility] ----------------------------------------------------------------------  #  Order #                     Accession #                Episode #  1  [id number]                    [id number]                 [id number]  2  [id number]                    [id number]                 [id number] ---------------------------------------------------------------------- Indications  [redacted] weeks gestation of pregnancy                Z3A.30  Maternal care for known or suspected poor      O36.5930  fetal growth, third trimester, not applicable or  unspecified IUGR  Poor obstetric history: Previous               O09.299  preeclampsia / eclampsia/gestational HTN  LR NIPS   Medical complication of pregnancy (Hx          O26.90  Postpartum Hemorrhage)  Obesity complicating pregnancy, third          O99.213  trimester ---------------------------------------------------------------------- Fetal Evaluation  Num Of Fetuses:         1  Fetal Heart Rate(bpm):  152  Cardiac Activity:       Observed  Presentation:           Breech  Placenta:               Anterior  P. Cord Insertion:      Previously Visualized  Amniotic Fluid  AFI FV:      Within normal limits  AFI Sum(cm)     %Tile       Largest Pocket(cm)  15.3            54  4.5  RUQ(cm)       RLQ(cm)       LUQ(cm)        LLQ(cm)  3.3           3.1           4.5            4.4 ---------------------------------------------------------------------- Biometry  BPD:      76.5  mm     G. Age:  30w 5d         56  %    CI:        74.42   %    70 - 86                                                          FL/HC:      19.6   %    19.2 - 21.4  HC:      281.5  mm     G. Age:  30w 6d         34  %    HC/AC:      1.10        0.99 - 1.21  AC:      256.9  mm     G. Age:  29w 6d         36  %    FL/BPD:     72.0   %    71 - 87  FL:       55.1  mm     G. Age:  29w 0d         11  %    FL/AC:      21.4   %    20 - 24  Est. FW:    1450  gm      3 lb 3 oz     25  % ---------------------------------------------------------------------- OB History  Blood Type:   O+  Gravidity:    2         Term:   1        Prem:   0        SAB:   0  TOP:          0       Ectopic:  0        Living: 1 ---------------------------------------------------------------------- Gestational Age  LMP:           30w 1d        Date:  11/15/19                 EDD:   08/21/20  U/S Today:     30w 1d                                        EDD:   08/21/20  Best:          30w 1d     Det. By:  LMP  (11/15/19)          EDD:   08/21/20 ---------------------------------------------------------------------- Anatomy  Cranium:               Previously seen  Aortic Arch:            Not well  visualized  Cavum:                 Previously seen        Ductal Arch:            Not well visualized  Ventricles:            Appears normal         Diaphragm:              Appears normal  Choroid Plexus:        Previously seen        Stomach:                Appears normal, left                                                                        sided  Cerebellum:            Previously seen        Abdomen:                Previously seen  Posterior Fossa:       Previously seen        Abdominal Wall:         Previously seen  Nuchal Fold:           Not applicable (>20    Cord Vessels:           Previously seen                         wks GA)  Face:                  Orbits and profile     Kidneys:                Previously seen                         previously seen  Lips:                  Previously seen        Bladder:                Appears normal  Thoracic:              Previously seen        Spine:                  Previously seen  Heart:                 Previously seen        Upper Extremities:      Previously seen  RVOT:                  Previously seen        Lower Extremities:      Previously seen  LVOT:                  Previously seen  Other:  Fetus appears to be female.  Heels and Right 5th digit prev visualized.          Technically difficult due to maternal habitus and fetal position. ---------------------------------------------------------------------- Doppler - Fetal Vessels  Umbilical Artery   S/D     %tile      RI    %tile   3.71       88    0.73       87 ---------------------------------------------------------------------- Cervix Uterus Adnexa  Cervix  Not visualized (advanced GA >24wks) ---------------------------------------------------------------------- Impression  Patient with fetal growth restriction and abnormal Doppler  studies return for fetal growth assessment.  Fetal growth is appropriate for gestational age.  Amniotic fluid  is normal and good fetal activity seen.  Umbilical artery   Doppler showed normal forward diastolic flow  I reassured the patient of normal fetal growth and and UA  Doppler studies.  She does not have gestational diabetes.  Blood pressure today at her office is 111/74 mmHg. ---------------------------------------------------------------------- Recommendations  -An appointment was made for her to return in 4 weeks for  fetal growth assessment. ----------------------------------------------------------------------                       Noralee Space, MD Electronically Signed Corrected Final Report  06/14/2020 01:10 pm ----------------------------------------------------------------------  [place] MFM OB FOLLOW UP  Result Date: 05/22/2020 ----------------------------------------------------------------------  OBSTETRICS REPORT                       (Signed Final 05/22/2020 11:15 am) ---------------------------------------------------------------------- Patient Info  ID #:       [id number]                          D.O.B.:  Aug 04, 1990 (29 yrs)  Name:       Kathryn Mccoy                   Visit Date: 05/22/2020 08:58 am ---------------------------------------------------------------------- Performed By  Attending:        Ma Rings MD         Ref. Address:     [address], [place]  Performed By:     Reinaldo Raddle            [facility]                                     [facility]  Referred By:      Everardo All ---------------------------------------------------------------------- Orders  #  Description                           Code        Ordered By  1  [place] MFM OB FOLLOW UP                   [phone]    YU FANG ----------------------------------------------------------------------  #  Order #                      Accession #                Episode #  1  [id number]                    [id number]                 [id number] ---------------------------------------------------------------------- Indications  Obesity complicating pregnancy, second         O99.212  trimester (BMI 38)  Poor obstetric history: Previous               O09.299  preeclampsia / eclampsia/gestational HTN  LR NIPS  Medical complication of pregnancy (Hx          O26.90  Postpartum Hemorrhage)  Encounter for antenatal screening for          Z36.3  malformations  [redacted] weeks gestation of pregnancy                Z3A.27 ---------------------------------------------------------------------- Fetal Evaluation  Num Of Fetuses:         1  Fetal Heart Rate(bpm):  140  Cardiac Activity:       Observed  Presentation:           Variable  Placenta:               Anterior Fundal  P. Cord Insertion:      Visualized, central  Amniotic Fluid  AFI FV:      Within normal limits                              Largest Pocket(cm)                              5.2 ---------------------------------------------------------------------- Biometry  BPD:        67  mm     G. Age:  27w 0d         38  %    CI:         79.8   %    70 - 86                                                          FL/HC:      19.2   %    18.6 - 20.4  HC:       237   mm     G. Age:  25w 5d        2.7  %    HC/AC:      1.06  1.05 - 1.21  AC:      223.9  mm     G. Age:  26w 6d         35  %    FL/BPD:     68.1   %    71 - 87  FL:       45.6  mm     G. Age:  25w 1d        2.6  %    FL/AC:      20.4   %    20 - 24  LV:        3.8  mm  Est. FW:     894  gm           2 lb     11  % ---------------------------------------------------------------------- OB History  Blood Type:   O+  Gravidity:    2         Term:   1        Prem:   0        SAB:   0  TOP:          0       Ectopic:  0        Living: 1 ---------------------------------------------------------------------- Gestational Age  LMP:           27w 0d        Date:   11/15/19                 EDD:   08/21/20  U/S Today:     26w 1d                                        EDD:   08/27/20  Best:          27w 0d     Det. By:  LMP  (11/15/19)          EDD:   08/21/20 ---------------------------------------------------------------------- Anatomy  Cranium:               Previously seen        Aortic Arch:            Not well visualized  Cavum:                 Previously seen        Ductal Arch:            Not well visualized  Ventricles:            Appears normal         Diaphragm:              Appears normal  Choroid Plexus:        Previously seen        Stomach:                Appears normal, left                                                                        sided  Cerebellum:  Previously seen        Abdomen:                Previously seen  Posterior Fossa:       Previously seen        Abdominal Wall:         Previously seen  Nuchal Fold:           Not applicable (>20    Cord Vessels:           Previously seen                         wks GA)  Face:                  Orbits and profile     Kidneys:                Appear normal                         previously seen  Lips:                  Previously seen        Bladder:                Appears normal  Thoracic:              Previously seen        Spine:                  Previously seen  Heart:                 Not well visualized    Upper Extremities:      Previously seen  RVOT:                  Previously seen        Lower Extremities:      Previously seen  LVOT:                  Previously seen  Other:  Fetus appears to be female. Heels and Right 5th digit prev visualized.          Technically difficult due to maternal habitus and fetal position. ---------------------------------------------------------------------- Doppler - Fetal Vessels  Umbilical Artery   S/D     %tile      RI    %tile                             ADFV    RDFV   5.25   > 97.5    0.81       97                                No      No  ---------------------------------------------------------------------- Cervix Uterus Adnexa  Cervix  Not visualized (advanced GA >24wks) ---------------------------------------------------------------------- Comments  This patient was seen for a follow up growth scan due to  borderline fetal growth restriction noted during her prior  ultrasound exams.  She denies any problems since her last  exam and reports feeling vigorous fetal movements  throughout the day.  On today's exam, the EFW measures at the 11th percentile  for her gestational age.  There was normal amniotic fluid  noted.  Fetal movements were noted during today's exam.  Doppler studies of the umbilical arteries showed an elevated  S/D ratio of 5.25. There were no signs of absent or reversed  end-diastolic flow.  Due to borderline fetal growth restriction with an elevated S/D  ratio, a follow-up umbilical artery Doppler study was  scheduled in 2 weeks.  We will reassess the fetal growth  again in 3 weeks.  The patient understands that should fetal growth restriction  continue to be suspected, we will follow her closely with  weekly fetal testing and umbilical artery Doppler studies.  She  understands and an earlier delivery may be indicated. ----------------------------------------------------------------------                   Ma Rings, MD Electronically Signed Final Report   05/22/2020 11:15 am ----------------------------------------------------------------------  [place] MFM OB LIMITED  Result Date: 06/07/2020 ----------------------------------------------------------------------  OBSTETRICS REPORT                       (Signed Final 06/07/2020 10:06 am) ---------------------------------------------------------------------- Patient Info  ID #:       [id number]                          D.O.B.:  08-11-1990 (29 yrs)  Name:       Kathryn Mccoy                   Visit Date: 06/07/2020 09:26 am  ---------------------------------------------------------------------- Performed By  Attending:        Lin Landsman      Ref. Address:     [address]                    MD                                                             [place], [place]  Performed By:     Percell Boston          [facility]  Referred By:      Everardo All ---------------------------------------------------------------------- Orders  #  Description  Code        Ordered By  1  [place] MFM UA CORD DOPPLER                [id number]    YU FANG  2  [place] MFM OB LIMITED                     [id number]    YU FANG ----------------------------------------------------------------------  #  Order #                     Accession #                Episode #  1  [id number]                    [id number]                 [id number]  2  [id number]                    [id number]                 [id number] ---------------------------------------------------------------------- Indications  Maternal care for known or suspected poor      O36.5930  fetal growth, third trimester, not applicable or  unspecified IUGR  [redacted] weeks gestation of pregnancy                Z3A.29  Obesity complicating pregnancy, second         O99.212  trimester (BMI 38)  Poor obstetric history: Previous               O09.299  preeclampsia / eclampsia/gestational HTN  LR NIPS  Medical complication of pregnancy (Hx          O26.90  Postpartum Hemorrhage)  Encounter for other antenatal screening        Z36.2  follow-up ---------------------------------------------------------------------- Fetal Evaluation  Num Of Fetuses:         1  Fetal Heart Rate(bpm):  138  Cardiac Activity:       Observed  Presentation:           Breech  Placenta:                Anterior  P. Cord Insertion:      Visualized, central  Amniotic Fluid  AFI FV:      Within normal limits  AFI Sum(cm)     %Tile       Largest Pocket(cm)  13.82           44          5.05  RUQ(cm)       RLQ(cm)       LUQ(cm)        LLQ(cm)  2.82          3.06          2.89           5.05 ---------------------------------------------------------------------- OB History  Blood Type:   O+  Gravidity:    2         Term:   1        Prem:   0        SAB:   0  TOP:          0       Ectopic:  0        Living: 1 ---------------------------------------------------------------------- Gestational Age  LMP:  29w 2d        Date:  11/15/19                 EDD:   08/21/20  Best:          29w 2d     Det. By:  LMP  (11/15/19)          EDD:   08/21/20 ---------------------------------------------------------------------- Anatomy  Thoracic:              Appears normal         Bladder:                Appears normal  Stomach:               Appears normal, left                         sided ---------------------------------------------------------------------- Doppler - Fetal Vessels  Umbilical Artery   S/D     %tile      RI    %tile                             ADFV    RDFV   4.52   > 97.5    0.78       96                                 N       N ---------------------------------------------------------------------- Cervix Uterus Adnexa  Cervix  Not visualized (advanced GA >24wks)  Uterus  No abnormality visualized.  Right Ovary  No adnexal mass visualized.  Left Ovary  No adnexal mass visualized.  Cul De Sac  No free fluid seen.  Adnexa  No abnormality visualized. ---------------------------------------------------------------------- Impression  Antenatal testing due to IUGR with an EFW 11th% with an  AC < 36th% with elevated UA Dopplers.  Biophysical profile 8/8 with good fetal movement and  amniotic fluid volume  UA Dopplers are again elevated with no evidence of AEDF or  REDF  We plan to repeat her growth and it's  possible that the FGR  will be resolved, however, given her elevated UA dopplers I  suspect that this may remain. If so we will continue weekly  UA Dopplers ---------------------------------------------------------------------- Recommendations  Continue weekly testing with UA Dopplers  Growth scheduled in 1 week ----------------------------------------------------------------------               Lin Landsman, MD Electronically Signed Final Report   06/07/2020 10:06 am ----------------------------------------------------------------------  [place] MFM UA CORD DOPPLER  Result Date: 06/14/2020 ----------------------------------------------------------------------  OBSTETRICS REPORT                    (Corrected Final 06/14/2020 01:10 pm) ---------------------------------------------------------------------- Patient Info  ID #:       [id number]                          D.O.B.:  1990-12-03 (29 yrs)  Name:       Kathryn Mccoy                   Visit Date: 06/13/2020 01:52 pm ---------------------------------------------------------------------- Performed By  Attending:        Noralee Space MD        Ref. Address:     [phone]                                                             [place], [place]  Performed By:     Jenel Luckshelsea Shortridge     [facility]                                     [facility]  Referred By:      Everardo AllWH Jette ---------------------------------------------------------------------- Orders  #  Description                           Code        Ordered By  1  US MFM OB FOLLOW UP                   [id number]    Rosana HoesYU FANG  2  US MFM UA CORD DOPPLER                [id number]    [facility] ----------------------------------------------------------------------  #  Order #                     Accession #                 Episode #  1  [id number]                    [id number]                 [id number]  2  [id number]                    [id number]                 [id number] ---------------------------------------------------------------------- Indications  [redacted] weeks gestation of pregnancy                Z3A.30  Maternal care for known or suspected poor      O36.5930  fetal growth, third trimester, not applicable or  unspecified IUGR  Poor obstetric history: Previous               O09.299  preeclampsia / eclampsia/gestational HTN  LR NIPS  Medical complication of pregnancy (Hx  O26.90  Postpartum Hemorrhage)  Obesity complicating pregnancy, third          O99.213  trimester ---------------------------------------------------------------------- Fetal Evaluation  Num Of Fetuses:         1  Fetal Heart Rate(bpm):  152  Cardiac Activity:       Observed  Presentation:           Breech  Placenta:               Anterior  P. Cord Insertion:      Previously Visualized  Amniotic Fluid  AFI FV:      Within normal limits  AFI Sum(cm)     %Tile       Largest Pocket(cm)  15.3            54          4.5  RUQ(cm)       RLQ(cm)       LUQ(cm)        LLQ(cm)  3.3           3.1           4.5            4.4 ---------------------------------------------------------------------- Biometry  BPD:      76.5  mm     G. Age:  30w 5d         56  %    CI:        74.42   %    70 - 86                                                          FL/HC:      19.6   %    19.2 - 21.4  HC:      281.5  mm     G. Age:  30w 6d         34  %    HC/AC:      1.10        0.99 - 1.21  AC:      256.9  mm     G. Age:  29w 6d         36  %    FL/BPD:     72.0   %    71 - 87  FL:       55.1  mm     G. Age:  29w 0d         11  %    FL/AC:      21.4   %    20 - 24  Est. FW:    1450  gm      3 lb 3 oz     25  % ---------------------------------------------------------------------- OB History  Blood Type:   O+  Gravidity:    2         Term:   1        Prem:   0        SAB:   0  TOP:           0       Ectopic:  0        Living: 1 ---------------------------------------------------------------------- Gestational Age  LMP:  30w 1d        Date:  11/15/19                 EDD:   08/21/20  U/S Today:     30w 1d                                        EDD:   08/21/20  Best:          30w 1d     Det. By:  LMP  (11/15/19)          EDD:   08/21/20 ---------------------------------------------------------------------- Anatomy  Cranium:               Previously seen        Aortic Arch:            Not well visualized  Cavum:                 Previously seen        Ductal Arch:            Not well visualized  Ventricles:            Appears normal         Diaphragm:              Appears normal  Choroid Plexus:        Previously seen        Stomach:                Appears normal, left                                                                        sided  Cerebellum:            Previously seen        Abdomen:                Previously seen  Posterior Fossa:       Previously seen        Abdominal Wall:         Previously seen  Nuchal Fold:           Not applicable (>20    Cord Vessels:           Previously seen                         wks GA)  Face:                  Orbits and profile     Kidneys:                Previously seen                         previously seen  Lips:                  Previously seen        Bladder:                Appears normal  Thoracic:              Previously seen        Spine:                  Previously seen  Heart:                 Previously seen        Upper Extremities:      Previously seen  RVOT:                  Previously seen        Lower Extremities:      Previously seen  LVOT:                  Previously seen  Other:  Fetus appears to be female. Heels and Right 5th digit prev visualized.          Technically difficult due to maternal habitus and fetal position. ---------------------------------------------------------------------- Doppler - Fetal Vessels  Umbilical  Artery   S/D     %tile      RI    %tile   3.71       88    0.73       87 ---------------------------------------------------------------------- Cervix Uterus Adnexa  Cervix  Not visualized (advanced GA >24wks) ---------------------------------------------------------------------- Impression  Patient with fetal growth restriction and abnormal Doppler  studies return for fetal growth assessment.  Fetal growth is appropriate for gestational age.  Amniotic fluid  is normal and good fetal activity seen.  Umbilical artery  Doppler showed normal forward diastolic flow  I reassured the patient of normal fetal growth and and UA  Doppler studies.  She does not have gestational diabetes.  Blood pressure today at her office is 111/74 mmHg. ---------------------------------------------------------------------- Recommendations  -An appointment was made for her to return in 4 weeks for  fetal growth assessment. ----------------------------------------------------------------------                       Noralee Space, MD Electronically Signed Corrected Final Report  06/14/2020 01:10 pm ----------------------------------------------------------------------  [place] MFM UA CORD DOPPLER  Result Date: 06/07/2020 ----------------------------------------------------------------------  OBSTETRICS REPORT                       (Signed Final 06/07/2020 10:06 am) ---------------------------------------------------------------------- Patient Info  ID #:       [id number]                          D.O.B.:  May 10, 1991 (29 yrs)  Name:       Kathryn Mccoy                   Visit Date: 06/07/2020 09:26 am ---------------------------------------------------------------------- Performed By  Attending:        Lin Landsman      Ref. Address:     [address]                    MD                                                             [place], [place]  Performed By:     Herbert Seta  Waken          [facility]  Referred By:      Everardo All ---------------------------------------------------------------------- Orders  #  Description                           Code        Ordered By  1  [place] MFM UA CORD DOPPLER                76820.02    YU FANG  2  [place] MFM OB LIMITED                     [id number]    YU FANG ----------------------------------------------------------------------  #  Order #                     Accession #                Episode #  1  [id number]                    [id number]                 [id number]  2  [id number]                    [id number]                 [id number] ---------------------------------------------------------------------- Indications  Maternal care for known or suspected poor      O36.5930  fetal growth, third trimester, not applicable or  unspecified IUGR  [redacted] weeks gestation of pregnancy                Z3A.29  Obesity complicating pregnancy, second         O99.212  trimester (BMI 38)  Poor obstetric history: Previous               O09.299  preeclampsia / eclampsia/gestational HTN  LR NIPS  Medical complication of pregnancy (Hx          O26.90  Postpartum Hemorrhage)  Encounter for other antenatal screening        Z36.2  follow-up ---------------------------------------------------------------------- Fetal Evaluation  Num Of Fetuses:         1  Fetal Heart Rate(bpm):  138  Cardiac  Activity:       Observed  Presentation:           Breech  Placenta:               Anterior  P. Cord Insertion:      Visualized, central  Amniotic Fluid  AFI FV:      Within normal limits  AFI Sum(cm)     %Tile       Largest Pocket(cm)  13.82           44          5.05  RUQ(cm)       RLQ(cm)       LUQ(cm)        LLQ(cm)  2.82          3.06          2.89           5.05  ---------------------------------------------------------------------- OB History  Blood Type:   O+  Gravidity:    2         Term:   1        Prem:   0        SAB:   0  TOP:          0       Ectopic:  0        Living: 1 ---------------------------------------------------------------------- Gestational Age  LMP:           29w 2d        Date:  11/15/19                 EDD:   08/21/20  Best:          29w 2d     Det. By:  LMP  (11/15/19)          EDD:   08/21/20 ---------------------------------------------------------------------- Anatomy  Thoracic:              Appears normal         Bladder:                Appears normal  Stomach:               Appears normal, left                         sided ---------------------------------------------------------------------- Doppler - Fetal Vessels  Umbilical Artery   S/D     %tile      RI    %tile                             ADFV    RDFV   4.52   > 97.5    0.78       96                                 N       N ---------------------------------------------------------------------- Cervix Uterus Adnexa  Cervix  Not visualized (advanced GA >24wks)  Uterus  No abnormality visualized.  Right Ovary  No adnexal mass visualized.  Left Ovary  No adnexal mass visualized.  Cul De Sac  No free fluid seen.  Adnexa  No abnormality visualized. ---------------------------------------------------------------------- Impression  Antenatal testing due to IUGR with an EFW 11th% with an  AC < 36th% with elevated UA Dopplers.  Biophysical profile 8/8 with good fetal movement and  amniotic fluid volume  UA Dopplers are again elevated with no evidence of AEDF or  REDF  We plan to repeat her growth and it's possible that the FGR  will be resolved, however, given her elevated UA dopplers I  suspect that this may remain. If so we will continue weekly  UA Dopplers ---------------------------------------------------------------------- Recommendations  Continue weekly testing with UA Dopplers  Growth  scheduled in 1 week ----------------------------------------------------------------------               Lin Landsman, MD Electronically Signed Final Report   06/07/2020 10:06 am ----------------------------------------------------------------------   Assessment and Plan:  Pregnancy: G2P1001 at [redacted]w[redacted]d 1. Marginal insertion of umbilical cord affecting management of mother  2. Encounter for supervision of normal pregnancy, antepartum, unspecified gravidity --Anticipatory guidance about next visits/weeks of pregnancy given. --Questions answered about L&D, admission, COVID testing, visitor policy etc. --Next visit in 2 weeks in office  3. Poor fetal growth affecting management of mother in second trimester, single or unspecified fetus --Growth [place] on 06/13/20 with appropriate growth, EFW 25%tile, next growth [place] in 4 weeks, MFM cancelled weekly dopplers  Preterm labor symptoms and general obstetric precautions including but not limited to vaginal bleeding, contractions, leaking of fluid and fetal movement were reviewed in detail with the patient. I discussed the assessment and treatment plan with the patient. The patient was provided an opportunity to ask questions and all were answered. The patient agreed with the plan and demonstrated an understanding of the instructions. The patient was advised to call back or seek an in-person office evaluation/go to [facility] at [facility] for any urgent or concerning symptoms. Please refer to After Visit Summary for other counseling recommendations.   I provided 10 minutes of face-to-face time during this encounter.  No follow-ups on file.  Future Appointments  Date Time Provider Department Center  07/11/2020 10:30 AM [facility] NURSE [facility]  07/11/2020 10:45 AM [facility]    Sharen Counter, CNM [facility] [employer], [facility]

## 2020-06-21 ENCOUNTER — Ambulatory Visit: Payer: 59

## 2020-06-27 ENCOUNTER — Ambulatory Visit: Payer: 59

## 2020-07-02 ENCOUNTER — Ambulatory Visit (INDEPENDENT_AMBULATORY_CARE_PROVIDER_SITE_OTHER): Payer: 59 | Admitting: Advanced Practice Midwife

## 2020-07-02 ENCOUNTER — Other Ambulatory Visit: Payer: Self-pay

## 2020-07-02 VITALS — BP 118/74 | HR 110 | Wt 215.0 lb

## 2020-07-02 DIAGNOSIS — Z23 Encounter for immunization: Secondary | ICD-10-CM | POA: Diagnosis not present

## 2020-07-02 DIAGNOSIS — Z3490 Encounter for supervision of normal pregnancy, unspecified, unspecified trimester: Secondary | ICD-10-CM

## 2020-07-02 DIAGNOSIS — O365920 Maternal care for other known or suspected poor fetal growth, second trimester, not applicable or unspecified: Secondary | ICD-10-CM

## 2020-07-02 DIAGNOSIS — O43199 Other malformation of placenta, unspecified trimester: Secondary | ICD-10-CM

## 2020-07-02 NOTE — Patient Instructions (Signed)
Try the [employer] at https://glass.com/.com starting at 37 weeks.   [place]host.cz.aspx">  Third Trimester of Pregnancy  The third trimester of pregnancy is from week 28 through week 40. This is months 7 through 9. The third trimester is a time when the unborn baby (fetus) is growing rapidly. At the end of the ninth month, the fetus is about 20 inches long and weighs 6-10 pounds. Body changes during your third trimester During the third trimester, your body will continue to go through many changes. The changes vary and generally return to normal after your baby is born. Physical changes  Your weight will continue to increase. You can expect to gain 25-35 pounds (11-16 kg) by the end of the pregnancy if you begin pregnancy at a normal weight. If you are underweight, you can expect to gain 28-40 lb (about 13-18 kg), and if you are overweight, you can expect to gain 15-25 lb (about 7-11 kg).  You may begin to get stretch marks on your hips, abdomen, and breasts.  Your breasts will continue to grow and may hurt. A yellow fluid (colostrum) may leak from your breasts. This is the first milk you are producing for your baby.  You may have changes in your hair. These can include thickening of your hair, rapid growth, and changes in texture. Some people also have hair loss during or after pregnancy, or hair that feels dry or thin.  Your belly button may stick out.  You may notice more swelling in your hands, face, or ankles. Health changes  You may have heartburn.  You may have constipation.  You may develop hemorrhoids.  You may develop swollen, bulging veins (varicose veins) in your legs.  You may have increased body aches in the pelvis, back, or thighs. This is due to weight gain and increased hormones that are relaxing your joints.  You may have increased tingling or numbness in your hands, arms, and legs. The skin on your abdomen  may also feel numb.  You may feel short of breath because of your expanding uterus. Other changes  You may urinate more often because the fetus is moving lower into your pelvis and pressing on your bladder.  You may have more problems sleeping. This may be caused by the size of your abdomen, an increased need to urinate, and an increase in your body's metabolism.  You may notice the fetus "dropping," or moving lower in your abdomen (lightening).  You may have increased vaginal discharge.  You may notice that you have pain around your pelvic bone as your uterus distends. Follow these instructions at home: Medicines  Follow your health care provider's instructions regarding medicine use. Specific medicines may be either safe or unsafe to take during pregnancy. Do not take any medicines unless approved by your health care provider.  Take a prenatal vitamin that contains at least 600 micrograms (mcg) of folic acid. Eating and drinking  Eat a healthy diet that includes fresh fruits and vegetables, whole grains, good sources of protein such as meat, eggs, or tofu, and low-fat dairy products.  Avoid raw meat and unpasteurized juice, milk, and cheese. These carry germs that can harm you and your baby.  Eat 4 or 5 small meals rather than 3 large meals a day.  You may need to take these actions to prevent or treat constipation: ? Drink enough fluid to keep your urine pale yellow. ? Eat foods that are high in fiber, such as beans, whole grains, and fresh  fruits and vegetables. ? Limit foods that are high in fat and processed sugars, such as fried or sweet foods. Activity  Exercise only as directed by your health care provider. Most people can continue their usual exercise routine during pregnancy. Try to exercise for 30 minutes at least 5 days a week. Stop exercising if you experience contractions in the uterus.  Stop exercising if you develop pain or cramping in the lower abdomen or lower  back.  Avoid heavy lifting.  Do not exercise if it is very hot or humid or if you are at a high altitude.  If you choose to, you may continue to have sex unless your health care provider tells you not to. Relieving pain and discomfort  Take frequent breaks and rest with your legs raised (elevated) if you have leg cramps or low back pain.  Take warm sitz baths to soothe any pain or discomfort caused by hemorrhoids. Use hemorrhoid cream if your health care provider approves.  Wear a supportive bra to prevent discomfort from breast tenderness.  If you develop varicose veins: ? Wear support hose as told by your health care provider. ? Elevate your feet for 15 minutes, 3-4 times a day. ? Limit salt in your diet. Safety  Talk to your health care provider before traveling far distances.  Do not use hot tubs, steam rooms, or saunas.  Wear your seat belt at all times when driving or riding in a car.  Talk with your health care provider if someone is verbally or physically abusive to you. Preparing for birth To prepare for the arrival of your baby:  Take prenatal classes to understand, practice, and ask questions about labor and delivery.  Visit the hospital and tour the maternity area.  Purchase a rear-facing car seat and make sure you know how to install it in your car.  Prepare the baby's room or sleeping area. Make sure to remove all pillows and stuffed animals from the baby's crib to prevent suffocation. General instructions  Avoid cat litter boxes and soil used by cats. These carry germs that can cause birth defects in the baby. If you have a cat, ask someone to clean the litter box for you.  Do not douche or use tampons. Do not use scented sanitary pads.  Do not use any products that contain nicotine or tobacco, such as cigarettes, e-cigarettes, and chewing tobacco. If you need help quitting, ask your health care provider.  Do not use any herbal remedies, illegal drugs, or  medicines that were not prescribed to you. Chemicals in these products can harm your baby.  Do not drink alcohol.  You will have more frequent prenatal exams during the third trimester. During a routine prenatal visit, your health care provider will do a physical exam, perform tests, and discuss your overall health. Keep all follow-up visits. This is important. Where to find more information  American Pregnancy Association: americanpregnancy.org  [employer] of Obstetricians and Gynecologists: https://www.todd-brady.net/  Office on [employer] [facility]: MightyReward.co.nz Contact a health care provider if you have:  A fever.  Mild pelvic cramps, pelvic pressure, or nagging pain in your abdominal area or lower back.  Vomiting or diarrhea.  Bad-smelling vaginal discharge or foul-smelling urine.  Pain when you urinate.  A headache that does not go away when you take medicine.  Visual changes or see spots in front of your eyes. Get help right away if:  Your water breaks.  You have regular contractions less than 5 minutes  apart.  You have spotting or bleeding from your vagina.  You have severe abdominal pain.  You have difficulty breathing.  You have chest pain.  You have fainting spells.  You have not felt your baby move for the time period told by your health care provider.  You have new or increased pain, swelling, or redness in an arm or leg. Summary  The third trimester of pregnancy is from week 28 through week 40 (months 7 through 9).  You may have more problems sleeping. This can be caused by the size of your abdomen, an increased need to urinate, and an increase in your body's metabolism.  You will have more frequent prenatal exams during the third trimester. Keep all follow-up visits. This is important. This information is not intended to replace advice given to you by your health care provider. Make sure you discuss any questions you have  with your health care provider. Document Revised: 10/25/2019 Document Reviewed: 08/31/2019 Elsevier Patient Education  2021 [employer].

## 2020-07-02 NOTE — Progress Notes (Signed)
   PRENATAL VISIT NOTE  Subjective:  Kathryn Mccoy is a 30 y.o. G2P1001 at [redacted]w[redacted]d being seen today for ongoing prenatal care.  She is currently monitored for the following issues for this low-risk pregnancy and has Supervision of normal pregnancy; History of postpartum hemorrhage; History of gestational hypertension; Marginal insertion of umbilical cord affecting management of mother; COVID-19 affecting pregnancy, antepartum; and Poor fetal growth affecting management of mother in second trimester on their problem list.  Patient reports no complaints.  Contractions: Not present. Vag. Bleeding: None.  Movement: Present. Denies leaking of fluid.   The following portions of the patient's history were reviewed and updated as appropriate: allergies, current medications, past family history, past medical history, past social history, past surgical history and problem list.   Objective:   Vitals:   07/02/20 0855  BP: 118/74  Pulse: (!) 110  Weight: 215 lb (97.5 kg)    Fetal Status: Fetal Heart Rate (bpm): 147   Movement: Present     General:  Alert, oriented and cooperative. Patient is in no acute distress.  Skin: Skin is warm and dry. No rash noted.   Cardiovascular: Normal heart rate noted  Respiratory: Normal respiratory effort, no problems with respiration noted  Abdomen: Soft, gravid, appropriate for gestational age.  Pain/Pressure: Absent     Pelvic: Cervical exam deferred        Extremities: Normal range of motion.  Edema: None  Mental Status: Normal mood and affect. Normal behavior. Normal judgment and thought content.   Assessment and Plan:  Pregnancy: G2P1001 at [redacted]w[redacted]d 1. Encounter for supervision of normal pregnancy, antepartum, unspecified gravidity --Anticipatory guidance about next visits/weeks of pregnancy given. --Next visit in 2 weeks virtual --Talked about first delivery, IOL for GHTN, OP baby, PPH with possible cervical or sulcus tear per pt description.  Sutures placed  in delivery room and pt took PO iron afterwards, no IV iron or blood products.   --Discussed use of [employer] at term to put baby in LOA position, discourage OP position.    2. Marginal insertion of umbilical cord affecting management of mother --Growth [place] Q 4 weeks, [place] scheduled 07/11/20  3. Poor fetal growth affecting management of mother in second trimester, single or unspecified fetus --IUGR noted but resolved with EFW 25%tile at last [place].  Next growth [place] on 07/11/20.  Preterm labor symptoms and general obstetric precautions including but not limited to vaginal bleeding, contractions, leaking of fluid and fetal movement were reviewed in detail with the patient. Please refer to After Visit Summary for other counseling recommendations.   Return in about 1 week (around 07/09/2020).  Future Appointments  Date Time Provider Department Center  07/11/2020 10:30 AM [facility] NURSE [facility]  07/11/2020 10:45 AM [facility]    Sharen Counter, CNM

## 2020-07-02 NOTE — Addendum Note (Signed)
Addended by: Granville Lewis on: 07/02/2020 09:53 AM   Modules accepted: Orders

## 2020-07-05 ENCOUNTER — Ambulatory Visit: Payer: 59

## 2020-07-11 ENCOUNTER — Other Ambulatory Visit: Payer: Self-pay

## 2020-07-11 ENCOUNTER — Ambulatory Visit: Payer: 59 | Admitting: *Deleted

## 2020-07-11 ENCOUNTER — Ambulatory Visit: Payer: 59 | Attending: Obstetrics and Gynecology

## 2020-07-11 ENCOUNTER — Encounter: Payer: Self-pay | Admitting: *Deleted

## 2020-07-11 DIAGNOSIS — O2693 Pregnancy related conditions, unspecified, third trimester: Secondary | ICD-10-CM | POA: Diagnosis not present

## 2020-07-11 DIAGNOSIS — Z362 Encounter for other antenatal screening follow-up: Secondary | ICD-10-CM

## 2020-07-11 DIAGNOSIS — Z8759 Personal history of other complications of pregnancy, childbirth and the puerperium: Secondary | ICD-10-CM | POA: Insufficient documentation

## 2020-07-11 DIAGNOSIS — O43199 Other malformation of placenta, unspecified trimester: Secondary | ICD-10-CM | POA: Insufficient documentation

## 2020-07-11 DIAGNOSIS — Z3A34 34 weeks gestation of pregnancy: Secondary | ICD-10-CM

## 2020-07-11 DIAGNOSIS — O09293 Supervision of pregnancy with other poor reproductive or obstetric history, third trimester: Secondary | ICD-10-CM

## 2020-07-11 DIAGNOSIS — O99213 Obesity complicating pregnancy, third trimester: Secondary | ICD-10-CM

## 2020-07-16 ENCOUNTER — Telehealth (INDEPENDENT_AMBULATORY_CARE_PROVIDER_SITE_OTHER): Payer: 59 | Admitting: Advanced Practice Midwife

## 2020-07-16 DIAGNOSIS — Z3490 Encounter for supervision of normal pregnancy, unspecified, unspecified trimester: Secondary | ICD-10-CM

## 2020-07-16 DIAGNOSIS — O365920 Maternal care for other known or suspected poor fetal growth, second trimester, not applicable or unspecified: Secondary | ICD-10-CM

## 2020-07-16 DIAGNOSIS — O365930 Maternal care for other known or suspected poor fetal growth, third trimester, not applicable or unspecified: Secondary | ICD-10-CM

## 2020-07-16 DIAGNOSIS — O43199 Other malformation of placenta, unspecified trimester: Secondary | ICD-10-CM

## 2020-07-16 DIAGNOSIS — O43193 Other malformation of placenta, third trimester: Secondary | ICD-10-CM

## 2020-07-16 DIAGNOSIS — Z3A34 34 weeks gestation of pregnancy: Secondary | ICD-10-CM

## 2020-07-16 NOTE — Progress Notes (Signed)
OBSTETRICS PRENATAL VIRTUAL VISIT ENCOUNTER NOTE  Provider location: [facility] [place]   I connected with Kathryn Mccoy on 07/16/20 at  9:10 AM EST by MyChart Video Encounter at home and verified that I am speaking with the correct person using two identifiers.   I discussed the limitations, risks, security and privacy concerns of performing an evaluation and management service virtually and the availability of in person appointments. I also discussed with the patient that there may be a patient responsible charge related to this service. The patient expressed understanding and agreed to proceed. Subjective:  Kathryn Mccoy is a 30 y.o. G2P1001 at [redacted]w[redacted]d being seen today for ongoing prenatal care.  She is currently monitored for the following issues for this low-risk pregnancy and has Supervision of normal pregnancy; History of postpartum hemorrhage; History of gestational hypertension; Marginal insertion of umbilical cord affecting management of mother; COVID-19 affecting pregnancy, antepartum; and Poor fetal growth affecting management of mother in second trimester on their problem list.  Patient reports no complaints.  Contractions: Not present. Vag. Bleeding: None.  Movement: Present. Denies any leaking of fluid.   The following portions of the patient's history were reviewed and updated as appropriate: allergies, current medications, past family history, past medical history, past social history, past surgical history and problem list.   Objective:   Vitals:   07/16/20 0851  BP: 117/67  Weight: 215 lb (97.5 kg)    Fetal Status:     Movement: Present     General:  Alert, oriented and cooperative. Patient is in no acute distress.  Respiratory: Normal respiratory effort, no problems with respiration noted  Mental Status: Normal mood and affect. Normal behavior. Normal judgment and thought content.  Rest of physical exam deferred due to type of  encounter  Imaging: [place] MFM OB FOLLOW UP  Result Date: 07/11/2020 ----------------------------------------------------------------------  OBSTETRICS REPORT                       (Signed Final 07/11/2020 11:28 am) ---------------------------------------------------------------------- Patient Info  ID #:       [id number]                          D.O.B.:  Oct 27, 1990 (29 yrs)  Name:       Kathryn Mccoy                   Visit Date: 07/11/2020 10:39 am ---------------------------------------------------------------------- Performed By  Attending:        Noralee Space MD        Ref. Address:     [address] [place]  Performed By:     Sandi Mealy        [facility]  Referred By:      Everardo All ---------------------------------------------------------------------- Orders  #  Description                           Code        Ordered By  1  [place] MFM OB FOLLOW UP                   [phone]    Noralee Space ----------------------------------------------------------------------  #  Order #                     Accession #                Episode #  1  [id number]                    [id number]                 [id number] ---------------------------------------------------------------------- Indications  Poor obstetric history: Previous               O09.299  preeclampsia / eclampsia/gestational HTN  Obesity complicating pregnancy, third          O99.213  trimester  Medical complication of pregnancy (Hx          O26.90  Postpartum Hemorrhage)  [redacted] weeks gestation of pregnancy                Z3A.34  Antenatal follow-up for nonvisualized fetal    Z36.2  anatomy  LR NIPS  ---------------------------------------------------------------------- Fetal Evaluation  Num Of Fetuses:         1  Fetal Heart Rate(bpm):  143  Cardiac Activity:       Observed  Presentation:           Cephalic  Placenta:               Anterior  P. Cord Insertion:      Previously Visualized  Amniotic Fluid  AFI FV:      Within normal limits  AFI Sum(cm)     %Tile       Largest Pocket(cm)  13.29           43          6.55  RUQ(cm)       RLQ(cm)       LUQ(cm)        LLQ(cm)  2.48          0             4.26           6.55 ---------------------------------------------------------------------- Biometry  BPD:      87.4  mm     G.  Age:  35w 2d         80  %    CI:         75.9   %    70 - 86                                                          FL/HC:      20.1   %    19.4 - 21.8  HC:       318   mm     G. Age:  35w 5d         56  %    HC/AC:      1.03        0.96 - 1.11  AC:      309.5  mm     G. Age:  34w 6d         75  %    FL/BPD:     73.1   %    71 - 87  FL:       63.9  mm     G. Age:  33w 0d         15  %    FL/AC:      20.6   %    20 - 24  HUM:      54.2  mm     G. Age:  31w 4d          6  %  LV:        5.6  mm  Est. FW:    2448  gm      5 lb 6 oz     55  % ---------------------------------------------------------------------- OB History  Blood Type:   O+  Gravidity:    2         Term:   1        Prem:   0        SAB:   0  TOP:          0       Ectopic:  0        Living: 1 ---------------------------------------------------------------------- Gestational Age  LMP:           34w 1d        Date:  11/15/19                 EDD:   08/21/20  U/S Today:     34w 5d                                        EDD:   08/17/20  Best:          34w 1d     Det. By:  LMP  (11/15/19)          EDD:   08/21/20 ---------------------------------------------------------------------- Anatomy  Cranium:               Previously seen        Aortic Arch:            Appears normal  Cavum:  Previously seen        Ductal Arch:             Not well visualized  Ventricles:            Appears normal         Diaphragm:              Previously seen  Choroid Plexus:        Previously seen        Stomach:                Appears normal, left                                                                        sided  Cerebellum:            Previously seen        Abdomen:                Previously seen  Posterior Fossa:       Previously seen        Abdominal Wall:         Previously seen  Nuchal Fold:           Not applicable (>20    Cord Vessels:           Previously seen                         wks GA)  Face:                  Orbits and profile     Kidneys:                Appear normal                         previously seen  Lips:                  Previously seen        Bladder:                Appears normal  Thoracic:              Previously seen        Spine:                  Previously seen  Heart:                 Previously seen        Upper Extremities:      Previously seen  RVOT:                  Previously seen        Lower Extremities:      Previously seen  LVOT:                  Previously seen  Other:  Fetus appears to be female. Heels and Right 5th digit prev visualized.          Technically difficult due to maternal habitus and fetal position. ---------------------------------------------------------------------- Cervix Uterus Adnexa  Cervix  Length:  3.22  cm.  Normal appearance by transabdominal scan. ---------------------------------------------------------------------- Impression  Maternal obesity. Patient returned for fetal growth  assessment. She does not have gestational diabetes.  Fetal growth restriction seen in early pregnancy has resolved.  Fetal growth is appropriate for gestational age.  Amniotic fluid  is normal and good fetal activity seen.  Cephalic presentation.  I reassured the patient of the findings. ---------------------------------------------------------------------- Recommendations  Follow-up scans as  clinically indicated. ----------------------------------------------------------------------                  Noralee Spaceavi Shankar, MD Electronically Signed Final Report   07/11/2020 11:28 am ----------------------------------------------------------------------   Assessment and Plan:  Pregnancy: G2P1001 at 1559w6d 1. Marginal insertion of umbilical cord affecting management of mother --Serial growth US, EFW 55%tile 2/10  2. Encounter for supervision of normal pregnancy, antepartum, unspecified gravidity --Pt reports good fetal movement, denies cramping, LOF, or vaginal bleeding --Anticipatory guidance about next visits/weeks of pregnancy given. --Next visit in 2 weeks in office for GBS  3. Poor fetal growth affecting management of mother in second trimester, single or unspecified fetus --Resolved, EFW 55%tile on 2/10  Preterm labor symptoms and general obstetric precautions including but not limited to vaginal bleeding, contractions, leaking of fluid and fetal movement were reviewed in detail with the patient. I discussed the assessment and treatment plan with the patient. The patient was provided an opportunity to ask questions and all were answered. The patient agreed with the plan and demonstrated an understanding of the instructions. The patient was advised to call back or seek an in-person office evaluation/[facility] for any urgent or concerning symptoms. Please refer to After Visit Summary for other counseling recommendations.   I provided 7 minutes of face-to-face time during this encounter.  No follow-ups on file.  No future appointments.  Sharen CounterLisa Leftwich-Kirby, CNM [facility]

## 2020-07-24 ENCOUNTER — Other Ambulatory Visit (HOSPITAL_COMMUNITY)
Admission: RE | Admit: 2020-07-24 | Discharge: 2020-07-24 | Disposition: A | Discharge status: Home / Self Care | Payer: 59 | Source: Ambulatory Visit | Attending: Obstetrics and Gynecology | Admitting: Obstetrics and Gynecology

## 2020-07-24 ENCOUNTER — Ambulatory Visit (INDEPENDENT_AMBULATORY_CARE_PROVIDER_SITE_OTHER): Payer: 59 | Admitting: Obstetrics and Gynecology

## 2020-07-24 ENCOUNTER — Other Ambulatory Visit: Payer: Self-pay

## 2020-07-24 VITALS — BP 127/70 | HR 103 | Wt 222.0 lb

## 2020-07-24 DIAGNOSIS — Z3480 Encounter for supervision of other normal pregnancy, unspecified trimester: Secondary | ICD-10-CM | POA: Diagnosis present

## 2020-07-24 LAB — OB RESULTS CONSOLE GBS: GBS: NEGATIVE

## 2020-07-24 NOTE — Patient Instructions (Addendum)
Cervical Ripening (to get your cervix ready for labor) : May try one or all:  Red Raspberry Leaf capsules:  two 300mg  or 400mg  tablets with each meal, 2-3 times a day  Potential Side Effects Of Raspberry Leaf:  Most women do not experience any side effects from drinking raspberry leaf tea. However, nausea and loose stools are possible     Evening Primrose Oil capsules: may take 1 to 3 capsules daily. May also prick one to release the oil and insert it into your vagina at night.  Some of the potential side effects:  Upset stomach  Loose stools or diarrhea  Headaches  Nausea   4 Dates a day (may taste better if warmed in microwave until soft). Found where raisins are in the grocery store   [name]  This circuit takes at least 90 minutes to complete so clear your schedule and make mental preparations so you can relax in your environment. The second step requires a lot of pillows so gather them up before beginning Before starting, you should empty your bladder! Have a nice drink nearby, and make sure it has a straw! If you are having contractions, this circuit should be done through contractions, try not to change positions between steps Before you begin...  "I named this 'circuit' after my friend , who shared and discussed it with me when I was working with a client whose labor seemed to be stalled out and no longer progressing... This circuit is useful to help get the baby lined up, ideally, in the "Left Occiput Anterior" (LOA) Position, both before labor begins and when some corrections need to be done during labor. Prenatally, this position set can help to rotate a baby. As a natural method of induction, this can help get things going if baby just needed a gentle nudge of position to set things off. To the best of my knowledge, this group of positions will not "hurt" a baby that is already lined up correctly." -   Step One: Open-knee  Chest Stay in this position for 30 minutes, start in cat/cow, then drop your chest as low as you can to the bed or the floor and your bottom as high as you can. Knees should be fairly wide apart, and the angle between the torso/thighs should be wider than 90 degrees. Wiggle around, prop with lots of pillows and use this time to get totally relaxed. This position allows the baby to scoot out of the pelvis a bit and gives them room to rotate, shift their head position, etc. If the pregnant person finds it helpful, careful positioning with a rebozo under the belly, with gentle tension from a support person behind can help maintain this position for the full 30 minutes.  Step Kathryn Mccoy Left Side Lying Roll to your left side, bringing your top leg as high as possible and keeping your bottom leg straight. Roll forward as much as possible, again using a lot of pillows. Sink into the bed and relax some more. If you fall asleep, that's totally okay and you can stay there! If not, stay here for at least another half an hour. Try and get your top right leg up towards your head and get as rolled over onto your belly as much as possible. If you repeat the circuit during labor, try alternating left and right sides. We know the photo the left is actually right side... just flip the image in your head.  Step Three: Moving  and Lunges Lunge, walk stairs facing sideways, 2 at a time, (have a spotter downstairs of you!), take a walk outside with one foot on the curb and the other on the street, sit on a birth ball and hula- anything that's upright and putting your pelvis in open, asymmetrical positions. Spend at least 30 minutes doing this one as well to give your baby a chance to move down. If you are lunging or stair or curb walking, you should lunge/walk/go up stairs in the direction that feels better to you. The key with the lunge is that the toes of the higher leg and mom's  belly button should be at right angles. Do not lunge over your knee, that closes the pelvis.     Kathryn Mccoy: Circuit Creator - [email or web address] Kathryn Mccoy, CD, BDT (DONA), LCCE, FACCE: Supporting Content - [email or web address] Kathryn Mccoy: Photography - [email or web address] Kathryn Mccoy CD/CDT (BAI): Print and Webmaster - [email or web address] [employer] [email or web address] 

## 2020-07-24 NOTE — Progress Notes (Signed)
   PRENATAL VISIT NOTE  Subjective:  Kathryn Mccoy is a 30 y.o. G2P1001 at [redacted]w[redacted]d being seen today for ongoing prenatal care.  She is currently monitored for the following issues for this low-risk pregnancy and has Supervision of normal pregnancy; History of postpartum hemorrhage; History of gestational hypertension; Marginal insertion of umbilical cord affecting management of mother; COVID-19 affecting pregnancy, antepartum; and Poor fetal growth affecting management of mother in second trimester on their problem list.  Patient reports no complaints.  Contractions: Not present. Vag. Bleeding: None.  Movement: Present. Denies leaking of fluid.   The following portions of the patient's history were reviewed and updated as appropriate: allergies, current medications, past family history, past medical history, past social history, past surgical history and problem list.   Objective:   Vitals:   07/24/20 0912  BP: 127/70  Pulse: (!) 103  Weight: 222 lb (100.7 kg)    Fetal Status: Fetal Heart Rate (bpm): 153 Fundal Height: 37 cm Movement: Present  Presentation: Undeterminable  General:  Alert, oriented and cooperative. Patient is in no acute distress.  Skin: Skin is warm and dry. No rash noted.   Cardiovascular: Normal heart rate noted  Respiratory: Normal respiratory effort, no problems with respiration noted  Abdomen: Soft, gravid, appropriate for gestational age.  Pain/Pressure: Absent     Pelvic: Cervical exam performed in the presence of a chaperone Dilation: 1 Effacement (%): Thick Station: -3  Extremities: Normal range of motion.  Edema: None  Mental Status: Normal mood and affect. Normal behavior. Normal judgment and thought content.   Assessment and Plan:  Pregnancy: G2P1001 at [redacted]w[redacted]d 1. Supervision of other normal pregnancy, antepartum  - Culture, beta strep (group b only) - Cervicovaginal ancillary only( [place]) - Hx of GHTN with previous pregnancy around this gestation.  BP good today.  She is not taking BASA  - Discussed location of [facility] and where to go when in labor.   Term labor symptoms and general obstetric precautions including but not limited to vaginal bleeding, contractions, leaking of fluid and fetal movement were reviewed in detail with the patient. Please refer to After Visit Summary for other counseling recommendations.   Return in about 1 week (around 07/31/2020).  No future appointments.  Venia Carbon, NP

## 2020-07-25 LAB — CERVICOVAGINAL ANCILLARY ONLY
Chlamydia: NEGATIVE
Comment: NEGATIVE
Comment: NORMAL
Neisseria Gonorrhea: NEGATIVE

## 2020-07-26 LAB — CULTURE, BETA STREP (GROUP B ONLY)

## 2020-07-27 LAB — CULTURE, BETA STREP (GROUP B ONLY)
MICRO NUMBER:: 11569491
SPECIMEN QUALITY:: ADEQUATE

## 2020-08-01 ENCOUNTER — Other Ambulatory Visit: Payer: Self-pay

## 2020-08-01 ENCOUNTER — Ambulatory Visit (INDEPENDENT_AMBULATORY_CARE_PROVIDER_SITE_OTHER): Payer: 59 | Admitting: Obstetrics and Gynecology

## 2020-08-01 ENCOUNTER — Encounter: Payer: Self-pay | Admitting: Obstetrics and Gynecology

## 2020-08-01 VITALS — BP 123/71 | HR 99 | Wt 222.0 lb

## 2020-08-01 DIAGNOSIS — Z8759 Personal history of other complications of pregnancy, childbirth and the puerperium: Secondary | ICD-10-CM

## 2020-08-01 DIAGNOSIS — Z3A37 37 weeks gestation of pregnancy: Secondary | ICD-10-CM

## 2020-08-01 DIAGNOSIS — O365920 Maternal care for other known or suspected poor fetal growth, second trimester, not applicable or unspecified: Secondary | ICD-10-CM

## 2020-08-01 DIAGNOSIS — Z3490 Encounter for supervision of normal pregnancy, unspecified, unspecified trimester: Secondary | ICD-10-CM

## 2020-08-01 DIAGNOSIS — O43199 Other malformation of placenta, unspecified trimester: Secondary | ICD-10-CM

## 2020-08-01 NOTE — Progress Notes (Signed)
   PRENATAL VISIT NOTE  Subjective:  Kathryn Mccoy is a 30 y.o. G2P1001 at [redacted]w[redacted]d being seen today for ongoing prenatal care.  She is currently monitored for the following issues for this high-risk pregnancy and has Supervision of normal pregnancy; History of postpartum hemorrhage; History of gestational hypertension; Marginal insertion of umbilical cord affecting management of mother; COVID-19 affecting pregnancy, antepartum; and Poor fetal growth affecting management of mother in second trimester on their problem list.  Patient reports no complaints.  Contractions: Not present. Vag. Bleeding: None.  Movement: Present. Denies leaking of fluid.   The following portions of the patient's history were reviewed and updated as appropriate: allergies, current medications, past family history, past medical history, past social history, past surgical history and problem list.   Objective:   Vitals:   08/01/20 1530  BP: 123/71  Pulse: 99  Weight: 222 lb (100.7 kg)    Fetal Status: Fetal Heart Rate (bpm): 145   Movement: Present     General:  Alert, oriented and cooperative. Patient is in no acute distress.  Skin: Skin is warm and dry. No rash noted.   Cardiovascular: Normal heart rate noted  Respiratory: Normal respiratory effort, no problems with respiration noted  Abdomen: Soft, gravid, appropriate for gestational age.  Pain/Pressure: Present   Cephalic by palpation  Pelvic: Cervical exam deferred        Extremities: Normal range of motion.  Edema: None  Mental Status: Normal mood and affect. Normal behavior. Normal judgment and thought content.   Assessment and Plan:  Pregnancy: G2P1001 at [redacted]w[redacted]d 1. History of postpartum hemorrhage  2. Encounter for supervision of normal pregnancy, antepartum, unspecified gravidity  3. History of gestational hypertension  4. Marginal insertion of umbilical cord affecting management of mother  5. Poor fetal growth affecting management of mother in  second trimester, single or unspecified fetus resolved  6. [redacted] weeks gestation of pregnancy  Term labor symptoms and general obstetric precautions including but not limited to vaginal bleeding, contractions, leaking of fluid and fetal movement were reviewed in detail with the patient. Please refer to After Visit Summary for other counseling recommendations.   Return in about 1 week (around 08/08/2020) for high OB, in person.  Future Appointments  Date Time Provider Department Center  08/01/2020  3:45 PM Conan Bowens, MD [facility]    Conan Bowens, MD

## 2020-08-08 ENCOUNTER — Encounter: Payer: Self-pay | Admitting: Obstetrics and Gynecology

## 2020-08-08 ENCOUNTER — Other Ambulatory Visit: Payer: Self-pay

## 2020-08-08 ENCOUNTER — Ambulatory Visit (INDEPENDENT_AMBULATORY_CARE_PROVIDER_SITE_OTHER): Payer: 59 | Admitting: Obstetrics and Gynecology

## 2020-08-08 VITALS — BP 128/73 | HR 112 | Wt 224.0 lb

## 2020-08-08 DIAGNOSIS — O43199 Other malformation of placenta, unspecified trimester: Secondary | ICD-10-CM

## 2020-08-08 DIAGNOSIS — Z3483 Encounter for supervision of other normal pregnancy, third trimester: Secondary | ICD-10-CM

## 2020-08-08 NOTE — Progress Notes (Signed)
° °  PRENATAL VISIT NOTE  Subjective:  Kathryn Mccoy is a 30 y.o. G2P1001 at [redacted]w[redacted]d being seen today for ongoing prenatal care.  She is currently monitored for the following issues for this high-risk pregnancy and has Supervision of normal pregnancy; History of postpartum hemorrhage; History of gestational hypertension; Marginal insertion of umbilical cord affecting management of mother; COVID-19 affecting pregnancy, antepartum; and Poor fetal growth affecting management of mother in second trimester on their problem list.  Patient reports no complaints.  Contractions: Not present. Vag. Bleeding: None.  Movement: Present. Denies leaking of fluid.   The following portions of the patient's history were reviewed and updated as appropriate: allergies, current medications, past family history, past medical history, past social history, past surgical history and problem list.   Objective:   Vitals:   08/08/20 1449  BP: 128/73  Pulse: (!) 112  Weight: 224 lb (101.6 kg)    Fetal Status: Fetal Heart Rate (bpm): 139 Fundal Height: 38 cm Movement: Present  Presentation: Vertex  General:  Alert, oriented and cooperative. Patient is in no acute distress.  Skin: Skin is warm and dry. No rash noted.   Cardiovascular: Normal heart rate noted  Respiratory: Normal respiratory effort, no problems with respiration noted  Abdomen: Soft, gravid, appropriate for gestational age.  Pain/Pressure: Present     Pelvic: Cervical exam performed in the presence of a chaperone Dilation: 1 Effacement (%): Thick Station: Ballotable  Extremities: Normal range of motion.  Edema: None  Mental Status: Normal mood and affect. Normal behavior. Normal judgment and thought content.   Assessment and Plan:  Pregnancy: G2P1001 at [redacted]w[redacted]d 1. Encounter for supervision of other normal pregnancy in third trimester Patient is doing well without complaints Discussed IOL at 41 weeks if no SOL given unfavorable cervix IOL to be scheduled  at next visit  2. Marginal insertion of umbilical cord affecting management of mother No further follow up growth needed  Term labor symptoms and general obstetric precautions including but not limited to vaginal bleeding, contractions, leaking of fluid and fetal movement were reviewed in detail with the patient. Please refer to After Visit Summary for other counseling recommendations.   Return in about 1 week (around 08/15/2020) for in person, ROB, High risk.  No future appointments.  Catalina Antigua, MD

## 2020-08-16 ENCOUNTER — Other Ambulatory Visit: Payer: Self-pay

## 2020-08-16 ENCOUNTER — Ambulatory Visit (INDEPENDENT_AMBULATORY_CARE_PROVIDER_SITE_OTHER): Payer: 59 | Admitting: Certified Nurse Midwife

## 2020-08-16 VITALS — BP 130/85 | HR 116 | Wt 223.0 lb

## 2020-08-16 DIAGNOSIS — Z3483 Encounter for supervision of other normal pregnancy, third trimester: Secondary | ICD-10-CM

## 2020-08-16 DIAGNOSIS — Z3A39 39 weeks gestation of pregnancy: Secondary | ICD-10-CM

## 2020-08-16 NOTE — Progress Notes (Signed)
Subjective:  Kathryn Mccoy is a 30 y.o. G2P1001 at [redacted]w[redacted]d being seen today for ongoing prenatal care.  She is currently monitored for the following issues for this low-risk pregnancy and has Supervision of normal pregnancy; History of postpartum hemorrhage; History of gestational hypertension; Marginal insertion of umbilical cord affecting management of mother; COVID-19 affecting pregnancy, antepartum; and Poor fetal growth affecting management of mother in second trimester on their problem list.  Patient reports no complaints.  Contractions: Not present. Vag. Bleeding: None.  Movement: Present. Denies leaking of fluid.   The following portions of the patient's history were reviewed and updated as appropriate: allergies, current medications, past family history, past medical history, past social history, past surgical history and problem list. Problem list updated.  Objective:   Vitals:   08/16/20 1000  BP: 130/85  Pulse: (!) 116  Weight: 223 lb (101.2 kg)    Fetal Status: Fetal Heart Rate (bpm): 156 Fundal Height: 39 cm Movement: Present  Presentation: Vertex  General:  Alert, oriented and cooperative. Patient is in no acute distress.  Skin: Skin is warm and dry. No rash noted.   Cardiovascular: Normal heart rate noted  Respiratory: Normal respiratory effort, no problems with respiration noted  Abdomen: Soft, gravid, appropriate for gestational age. Pain/Pressure: Present     Pelvic: Vag. Bleeding: None Vag D/C Character: Thin   Cervical exam performed Dilation: 1 Effacement (%): 40 Station: -2  Extremities: Normal range of motion.  Edema: None  Mental Status: Normal mood and affect. Normal behavior. Normal judgment and thought content.   Urinalysis:      Assessment and Plan:  Pregnancy: G2P1001 at [redacted]w[redacted]d  1. [redacted] weeks gestation of pregnancy  2. Encounter for supervision of other normal pregnancy in third trimester -NST/AFI next week -continue labor prep  Term labor symptoms and  general obstetric precautions including but not limited to vaginal bleeding, contractions, leaking of fluid and fetal movement were reviewed in detail with the patient. Please refer to After Visit Summary for other counseling recommendations.  Return in about 1 week (around 08/23/2020).   Donette Larry, CNM

## 2020-08-16 NOTE — Patient Instructions (Signed)

## 2020-08-22 ENCOUNTER — Other Ambulatory Visit: Payer: Self-pay

## 2020-08-22 ENCOUNTER — Other Ambulatory Visit (HOSPITAL_COMMUNITY): Payer: Self-pay | Admitting: Advanced Practice Midwife

## 2020-08-22 ENCOUNTER — Encounter: Payer: Self-pay | Admitting: Obstetrics and Gynecology

## 2020-08-22 ENCOUNTER — Ambulatory Visit (INDEPENDENT_AMBULATORY_CARE_PROVIDER_SITE_OTHER): Payer: 59 | Admitting: Obstetrics and Gynecology

## 2020-08-22 VITALS — BP 121/82 | HR 100 | Wt 226.0 lb

## 2020-08-22 DIAGNOSIS — Z3483 Encounter for supervision of other normal pregnancy, third trimester: Secondary | ICD-10-CM

## 2020-08-22 DIAGNOSIS — Z3A40 40 weeks gestation of pregnancy: Secondary | ICD-10-CM | POA: Diagnosis not present

## 2020-08-22 DIAGNOSIS — Z8759 Personal history of other complications of pregnancy, childbirth and the puerperium: Secondary | ICD-10-CM

## 2020-08-22 DIAGNOSIS — O43199 Other malformation of placenta, unspecified trimester: Secondary | ICD-10-CM

## 2020-08-22 DIAGNOSIS — O365920 Maternal care for other known or suspected poor fetal growth, second trimester, not applicable or unspecified: Secondary | ICD-10-CM

## 2020-08-22 DIAGNOSIS — O480 Post-term pregnancy: Secondary | ICD-10-CM | POA: Diagnosis not present

## 2020-08-22 NOTE — Progress Notes (Signed)
° °  PRENATAL VISIT NOTE  Subjective:  Kathryn Mccoy is a 30 y.o. G2P1001 at [redacted]w[redacted]d being seen today for ongoing prenatal care.  She is currently monitored for the following issues for this low-risk pregnancy and has Supervision of normal pregnancy; History of postpartum hemorrhage; History of gestational hypertension; Marginal insertion of umbilical cord affecting management of mother; COVID-19 affecting pregnancy, antepartum; and Poor fetal growth affecting management of mother in second trimester on their problem list.  Patient reports no complaints.  Contractions: Not present. Vag. Bleeding: None.  Movement: Present. Denies leaking of fluid.   The following portions of the patient's history were reviewed and updated as appropriate: allergies, current medications, past family history, past medical history, past social history, past surgical history and problem list.   Objective:   Vitals:   08/22/20 1004  BP: 121/82  Pulse: 100  Weight: 226 lb (102.5 kg)    Fetal Status:     Movement: Present  Presentation: Vertex  General:  Alert, oriented and cooperative. Patient is in no acute distress.  Skin: Skin is warm and dry. No rash noted.   Cardiovascular: Normal heart rate noted  Respiratory: Normal respiratory effort, no problems with respiration noted  Abdomen: Soft, gravid, appropriate for gestational age.  Pain/Pressure: Present     Pelvic: Cervical exam performed in the presence of a chaperone Dilation: 1 Effacement (%): 50 Station: -3  Extremities: Normal range of motion.  Edema: None  Mental Status: Normal mood and affect. Normal behavior. Normal judgment and thought content.   Assessment and Plan:  Pregnancy: G2P1001 at [redacted]w[redacted]d 1. History of postpartum hemorrhage  2. Encounter for supervision of other normal pregnancy in third trimester Attempted membrane sweep but unable to do so 2/2 patient discomfort Reviewed induction, plan for induction 40.6, she is agreeable Orders for  induction placed  3. History of gestational hypertension BP normotensive today  4. Marginal insertion of umbilical cord affecting management of mother  5. Poor fetal growth affecting management of mother in second trimester, single or unspecified fetus Resolved  Term labor symptoms and general obstetric precautions including but not limited to vaginal bleeding, contractions, leaking of fluid and fetal movement were reviewed in detail with the patient. Please refer to After Visit Summary for other counseling recommendations.   Return in about 5 weeks (around 09/26/2020) for post partum check.  Future Appointments  Date Time Provider Department Center  08/27/2020  8:55 AM [facility] SCHED ROOM [facility] None  09/26/2020  8:15 AM Anyanwu, Jethro Bastos, MD [facility]    Conan Bowens, MD

## 2020-08-23 ENCOUNTER — Telehealth (HOSPITAL_COMMUNITY): Payer: Self-pay | Admitting: *Deleted

## 2020-08-23 NOTE — Telephone Encounter (Signed)
Preadmission screen  

## 2020-08-26 ENCOUNTER — Other Ambulatory Visit (HOSPITAL_COMMUNITY)
Admission: RE | Admit: 2020-08-26 | Discharge: 2020-08-26 | Disposition: A | Discharge status: Home / Self Care | Payer: 59 | Source: Ambulatory Visit | Attending: Family Medicine | Admitting: Family Medicine

## 2020-08-26 DIAGNOSIS — Z01812 Encounter for preprocedural laboratory examination: Secondary | ICD-10-CM | POA: Insufficient documentation

## 2020-08-26 DIAGNOSIS — Z20822 Contact with and (suspected) exposure to covid-19: Secondary | ICD-10-CM | POA: Insufficient documentation

## 2020-08-27 ENCOUNTER — Inpatient Hospital Stay (HOSPITAL_COMMUNITY): Payer: 59 | Admitting: Anesthesiology

## 2020-08-27 ENCOUNTER — Inpatient Hospital Stay (HOSPITAL_COMMUNITY)
Admission: AD | Admit: 2020-08-27 | Discharge: 2020-08-30 | DRG: 806 | Disposition: A | Discharge status: Home / Self Care | Payer: 59 | Attending: Obstetrics and Gynecology | Admitting: Obstetrics and Gynecology

## 2020-08-27 ENCOUNTER — Encounter (HOSPITAL_COMMUNITY): Payer: Self-pay | Admitting: Obstetrics and Gynecology

## 2020-08-27 ENCOUNTER — Inpatient Hospital Stay (HOSPITAL_COMMUNITY): Payer: 59

## 2020-08-27 ENCOUNTER — Other Ambulatory Visit: Payer: Self-pay

## 2020-08-27 DIAGNOSIS — O43199 Other malformation of placenta, unspecified trimester: Secondary | ICD-10-CM | POA: Diagnosis present

## 2020-08-27 DIAGNOSIS — O99214 Obesity complicating childbirth: Secondary | ICD-10-CM | POA: Diagnosis present

## 2020-08-27 DIAGNOSIS — Z20822 Contact with and (suspected) exposure to covid-19: Secondary | ICD-10-CM | POA: Diagnosis present

## 2020-08-27 DIAGNOSIS — O99891 Other specified diseases and conditions complicating pregnancy: Secondary | ICD-10-CM

## 2020-08-27 DIAGNOSIS — Z8616 Personal history of COVID-19: Secondary | ICD-10-CM | POA: Diagnosis not present

## 2020-08-27 DIAGNOSIS — Z3490 Encounter for supervision of normal pregnancy, unspecified, unspecified trimester: Secondary | ICD-10-CM

## 2020-08-27 DIAGNOSIS — Z3A40 40 weeks gestation of pregnancy: Secondary | ICD-10-CM | POA: Diagnosis not present

## 2020-08-27 DIAGNOSIS — Z2839 Other underimmunization status: Secondary | ICD-10-CM

## 2020-08-27 DIAGNOSIS — O480 Post-term pregnancy: Secondary | ICD-10-CM | POA: Diagnosis not present

## 2020-08-27 DIAGNOSIS — Z3A41 41 weeks gestation of pregnancy: Secondary | ICD-10-CM | POA: Diagnosis not present

## 2020-08-27 DIAGNOSIS — Z8759 Personal history of other complications of pregnancy, childbirth and the puerperium: Secondary | ICD-10-CM

## 2020-08-27 DIAGNOSIS — O43123 Velamentous insertion of umbilical cord, third trimester: Secondary | ICD-10-CM | POA: Diagnosis present

## 2020-08-27 DIAGNOSIS — O09899 Supervision of other high risk pregnancies, unspecified trimester: Secondary | ICD-10-CM

## 2020-08-27 LAB — CBC
HCT: 34.6 % — ABNORMAL LOW (ref 36.0–46.0)
Hemoglobin: 11.3 g/dL — ABNORMAL LOW (ref 12.0–15.0)
MCH: 28.1 pg (ref 26.0–34.0)
MCHC: 32.7 g/dL (ref 30.0–36.0)
MCV: 86.1 fL (ref 80.0–100.0)
Platelets: 235 10*3/uL (ref 150–400)
RBC: 4.02 MIL/uL (ref 3.87–5.11)
RDW: 15.1 % (ref 11.5–15.5)
WBC: 6.5 10*3/uL (ref 4.0–10.5)
nRBC: 0 % (ref 0.0–0.2)

## 2020-08-27 LAB — TYPE AND SCREEN
ABO/RH(D): O POS
Antibody Screen: NEGATIVE

## 2020-08-27 LAB — SARS CORONAVIRUS 2 (TAT 6-24 HRS): SARS Coronavirus 2: NEGATIVE

## 2020-08-27 MED ORDER — DIPHENHYDRAMINE HCL 50 MG/ML IJ SOLN: 12.5000 mg | INTRAMUSCULAR | Status: DC | PRN

## 2020-08-27 MED ORDER — LIDOCAINE HCL (PF) 1 % IJ SOLN: 30.0000 mL | INTRAMUSCULAR | Status: DC | PRN

## 2020-08-27 MED ORDER — OXYTOCIN BOLUS FROM INFUSION
333.0000 mL | Freq: Once | INTRAVENOUS | Status: AC
  Administered 2020-08-28: 333 mL via INTRAVENOUS

## 2020-08-27 MED ORDER — LACTATED RINGERS IV SOLN: INTRAVENOUS | Status: DC

## 2020-08-27 MED ORDER — ACETAMINOPHEN 325 MG PO TABS: 650.0000 mg | ORAL_TABLET | ORAL | Status: DC | PRN

## 2020-08-27 MED ORDER — PHENYLEPHRINE 40 MCG/ML (10ML) SYRINGE FOR IV PUSH (FOR BLOOD PRESSURE SUPPORT): 80.0000 ug | PREFILLED_SYRINGE | INTRAVENOUS | Status: DC | PRN

## 2020-08-27 MED ORDER — LACTATED RINGERS IV SOLN: 500.0000 mL | Freq: Once | INTRAVENOUS | Status: DC

## 2020-08-27 MED ORDER — TERBUTALINE SULFATE 1 MG/ML IJ SOLN: 0.2500 mg | Freq: Once | INTRAMUSCULAR | Status: DC | PRN

## 2020-08-27 MED ORDER — OXYCODONE-ACETAMINOPHEN 5-325 MG PO TABS: 2.0000 | ORAL_TABLET | ORAL | Status: DC | PRN

## 2020-08-27 MED ORDER — MISOPROSTOL 50MCG HALF TABLET
50.0000 ug | ORAL_TABLET | ORAL | Status: DC
  Administered 2020-08-27: 50 ug via BUCCAL
  Filled 2020-08-27 (×2): qty 1

## 2020-08-27 MED ORDER — FENTANYL-BUPIVACAINE-NACL 0.5-0.125-0.9 MG/250ML-% EP SOLN
12.0000 mL/h | EPIDURAL | Status: DC | PRN
  Administered 2020-08-27: 12 mL/h via EPIDURAL
  Filled 2020-08-27: qty 250

## 2020-08-27 MED ORDER — OXYTOCIN-SODIUM CHLORIDE 30-0.9 UT/500ML-% IV SOLN
2.5000 [IU]/h | INTRAVENOUS | Status: DC
  Administered 2020-08-28: 2.5 [IU]/h via INTRAVENOUS

## 2020-08-27 MED ORDER — OXYTOCIN-SODIUM CHLORIDE 30-0.9 UT/500ML-% IV SOLN
1.0000 m[IU]/min | INTRAVENOUS | Status: DC
  Administered 2020-08-27: 2 m[IU]/min via INTRAVENOUS
  Filled 2020-08-27: qty 500

## 2020-08-27 MED ORDER — MISOPROSTOL 25 MCG QUARTER TABLET
25.0000 ug | ORAL_TABLET | ORAL | Status: DC | PRN
  Administered 2020-08-27: 25 ug via VAGINAL
  Filled 2020-08-27: qty 1

## 2020-08-27 MED ORDER — ONDANSETRON HCL 4 MG/2ML IJ SOLN
4.0000 mg | Freq: Four times a day (QID) | INTRAMUSCULAR | Status: DC | PRN
  Administered 2020-08-28: 4 mg via INTRAVENOUS
  Filled 2020-08-27: qty 2

## 2020-08-27 MED ORDER — LIDOCAINE HCL (PF) 1 % IJ SOLN
INTRAMUSCULAR | Status: DC | PRN
  Administered 2020-08-27: 8 mL via EPIDURAL

## 2020-08-27 MED ORDER — HYDROCORTISONE 1 % EX CREA
1.0000 "application " | TOPICAL_CREAM | Freq: Two times a day (BID) | CUTANEOUS | Status: DC | PRN
  Filled 2020-08-27: qty 28

## 2020-08-27 MED ORDER — SOD CITRATE-CITRIC ACID 500-334 MG/5ML PO SOLN: 30.0000 mL | ORAL | Status: DC | PRN

## 2020-08-27 MED ORDER — EPHEDRINE 5 MG/ML INJ: 10.0000 mg | INTRAVENOUS | Status: DC | PRN

## 2020-08-27 MED ORDER — OXYCODONE-ACETAMINOPHEN 5-325 MG PO TABS
1.0000 | ORAL_TABLET | ORAL | Status: DC | PRN
Start: 2020-08-27 — End: 2020-08-28

## 2020-08-27 MED ORDER — LACTATED RINGERS IV SOLN: 500.0000 mL | INTRAVENOUS | Status: DC | PRN

## 2020-08-27 NOTE — Progress Notes (Signed)
  Patient ID: Kathryn Mccoy, female   DOB: 1990/08/11, 30 y.o.   MRN: [id number]  Cervical foley recently out; s/p cytotec x 2 doses  BP 112/63, P 103 FHR 130s, +accels, no decels Ctx irreg Cx deferred right now (was 4/60/vtx -2 when foley came out)  IUP@40 .6wks Cx now favorable  Will start Pit 2x2 and titrate to achieve active labor Anticipate vag del Watch for [facility]  Arabella Merles [facility] 08/27/2020 8:06 PM

## 2020-08-27 NOTE — Anesthesia Preprocedure Evaluation (Signed)
Anesthesia Evaluation  Patient identified by MRN, date of birth, ID band Patient awake    Reviewed: Allergy & Precautions, Patient's Chart, lab work & pertinent test results  Airway Mallampati: III  TM Distance: >3 FB Neck ROM: Full    Dental no notable dental hx.    Pulmonary neg pulmonary ROS,  covid 05/2020   Pulmonary exam normal breath sounds clear to auscultation       Cardiovascular hypertension, negative cardio ROS Normal cardiovascular exam Rhythm:Regular Rate:Normal     Neuro/Psych negative neurological ROS  negative psych ROS   GI/Hepatic negative GI ROS, Neg liver ROS,   Endo/Other  Morbid obesity  Renal/GU negative Renal ROS  negative genitourinary   Musculoskeletal negative musculoskeletal ROS (+)   Abdominal   Peds negative pediatric ROS (+)  Hematology negative hematology ROS (+)   Anesthesia Other Findings eczema  Reproductive/Obstetrics negative OB ROS                             Anesthesia Physical Anesthesia Plan  ASA: III  Anesthesia Plan: Epidural   Post-op Pain Management:    Induction:   PONV Risk Score and Plan: 2 and Treatment may vary due to age or medical condition  Airway Management Planned: Natural Airway  Additional Equipment:   Intra-op Plan:   Post-operative Plan:   Informed Consent: I have reviewed the patients History and Physical, chart, labs and discussed the procedure including the risks, benefits and alternatives for the proposed anesthesia with the patient or authorized representative who has indicated his/her understanding and acceptance.       Plan Discussed with: Anesthesiologist  Anesthesia Plan Comments:         Anesthesia Quick Evaluation

## 2020-08-27 NOTE — Anesthesia Procedure Notes (Signed)
Epidural Patient location during procedure: OB Start time: 08/27/2020 10:05 PM End time: 08/27/2020 10:15 PM  Staffing Anesthesiologist: Mellody Dance, MD Performed: anesthesiologist   Preanesthetic Checklist Completed: patient identified, IV checked, site marked, risks and benefits discussed, monitors and equipment checked, pre-op evaluation and timeout performed  Epidural Patient position: sitting Prep: DuraPrep Patient monitoring: heart rate, cardiac monitor, continuous pulse ox and blood pressure Approach: midline Location: L2-L3 Injection technique: LOR saline  Needle:  Needle type: Tuohy  Needle gauge: 17 G Needle length: 9 cm Needle insertion depth: 5 cm Catheter type: closed end flexible Catheter size: 20 Guage Catheter at skin depth: 11 cm Test dose: negative and Other  Assessment Events: blood not aspirated, injection not painful, no injection resistance and negative IV test  Additional Notes Informed consent obtained prior to proceeding including risk of failure, 1% risk of PDPH, risk of minor discomfort and bruising.  Discussed rare but serious complications including epidural abscess, permanent nerve injury, epidural hematoma.  Discussed alternatives to epidural analgesia and patient desires to proceed.  Timeout performed pre-procedure verifying patient name, procedure, and platelet count.  Patient tolerated procedure well.

## 2020-08-27 NOTE — H&P (Signed)
Kathryn Mccoy is a 30 y.o. G47P1001 female at [redacted]w[redacted]d by LMP c/w 19wk scan presenting for IOL due to postdates.   Reports active fetal movement, contractions: none, vaginal bleeding: none, membranes: intact.  Initiated prenatal care at [facility] at 12.6 wks.   Most recent u/s at 34.1wks for previous SGA, EFW 55th%, cephalic, nl fluid, ant placenta.   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 COVID-19 affecting pregnancy, antepartum 05/07/2020   COVID in early pregnancy, patient lost 5 lbs, recovered without residual COVID symptoms Serial growth [place]  . Eczema   . Gestational hypertension   . HPV in female   . Poor fetal growth affecting management of mother in second trimester 05/07/2020   [place] on 11/30 with EFW 7%tile, normal dopplers, follow up ordered Growth on 1/13 with EFW 25%tile, weekly dopplers cancelled, growth in 4 weeks EFW 55%tile on 07/11/20  . Vaginal Pap smear, abnormal     Past Surgical History: History reviewed. No pertinent surgical history.  Obstetrical History: OB History    Gravida  2   Para  1   Term  1   Preterm      AB      Living  1     SAB      IAB      Ectopic      Multiple      Live Births  1           Social History: Social History   Socioeconomic History  . Marital status: Married    Spouse name: Not on file  . Number of children: Not on file  . Years of education: Not on file  . Highest education level: Not on file  Occupational History  . Occupation: homemaker  Tobacco Use  . Smoking status: Never Smoker  . Smokeless tobacco: Never Used  Vaping Use  . Vaping Use: Former  . Substances: Nicotine  Substance and Sexual Activity  . Alcohol use: Never  . Drug use: Never  . Sexual activity: Yes    Partners: Male    Birth control/protection: None  Other Topics  Concern  . Not on file  Social History Narrative  . Not on file   Social Determinants of Health   Financial Resource Strain: Not on file  Food Insecurity: Not on file  Transportation Needs: Not on file  Physical Activity: Not on file  Stress: Not on file  Social Connections: Not on file    Family History: Family History  Problem Relation Age of Onset  . Hypertension Father   . Breast cancer Mother 26    Allergies: No Known Allergies  Medications Prior to Admission  Medication Sig Dispense Refill Last Dose  . Evening Primrose Oil 1000 MG CAPS Take 1 capsule by mouth at bedtime.   Past Week at Unknown time  . famotidine (PEPCID) 10 MG tablet Take 10 mg by mouth 2 (two) times daily.   08/26/2020 at Unknown time  . hydrocortisone cream 1 % Apply 1 application topically 2 (two) times daily.   08/27/2020 at Unknown time  . Prenatal Vit-Fe Fumarate-FA (PRENATAL MULTIVITAMIN) TABS tablet Take 1 tablet by mouth at bedtime.   Past Week at Unknown time    Review of Systems  Pertinent pos/neg as indicated in HPI  Blood pressure 131/78, pulse (!) 117, temperature  98.9 F (37.2 C), temperature source Oral, resp. rate 18, height 5\' 2"  (1.575 m), weight 103 kg, last menstrual period 11/15/2019, SpO2 99 %. General appearance: alert, cooperative and no distress Lungs: clear to auscultation bilaterally Heart: regular rate and rhythm Abdomen: gravid, soft, non-tender, EFW by Leopold's approximately 7lbs Extremities: 1+ edema DTR's nl  Fetal monitoring: FHR: 135-140 bpm, variability: moderate,  Accelerations: Present,  decelerations:  Absent Uterine activity: irreg Dilation: 1 Effacement (%): Thick Station: -3 Exam by:: [email or web address], RN Presentation: cephalic   Prenatal labs: ABO, Rh: --/--/PENDING (03/29 1000) Antibody: PENDING (03/29 1000) Rubella: 0.96 (09/14 1607) RPR: NON-REACTIVE (01/04 0947)  HBsAg: NON-REACTIVE (09/14 1607)  HIV: NON-REACTIVE (01/04 0947)  GBS:  Negative/-- (02/23 0000)  2hr GTT: 72/148/131  Prenatal Transfer Tool  Maternal Diabetes: No Genetic Screening: Normal Maternal Ultrasounds/Referrals: Normal Fetal Ultrasounds or other Referrals:  None Maternal Substance Abuse:  No Significant Maternal Medications:  None Significant Maternal Lab Results: Group B Strep negative  Results for orders placed or performed during the hospital encounter of 08/27/20 (from the past 24 hour(s))  Type and screen   Collection Time: 08/27/20 10:00 AM  Result Value Ref Range   ABO/RH(D) PENDING    Antibody Screen PENDING    Sample Expiration      08/30/2020,2359 Performed at [facility], [address] [place], [place]   Results for orders placed or performed during the hospital encounter of 08/26/20 (from the past 24 hour(s))  SARS CORONAVIRUS 2 (TAT 6-24 HRS) Nasopharyngeal Nasopharyngeal Swab   Collection Time: 08/26/20  1:34 PM   Specimen: Nasopharyngeal Swab  Result Value Ref Range   SARS Coronavirus 2 NEGATIVE NEGATIVE     Assessment:  [redacted]w[redacted]d SIUP  G2P1001  Cx unfavorable  Cat 1 FHR  GBS Negative/-- (02/23 0000)  Plan:  Admit to L&D  IV pain meds/epidural prn active labor  Plan cervical ripening with cytotec/cervical foley followed by Pit/AROM prn  Anticipate vag del   Plans to breastfeed  Contraception: NFP  11[birth year]09[birth year]1985 CNM 08/27/2020, 10:47 AM

## 2020-08-27 NOTE — Progress Notes (Signed)
Patient ID: Kathryn Mccoy, female   DOB: 01-09-1991, 30 y.o.   MRN: [id number]  S/p cytotec x 1 dose (vaginal); feels well  BP 128/73, P 98 FHR 130-140s, +accels, no decels, Cat 1 Rare, mild ctx Cx ant/1/thick/vtx -2  IUP@40 .6wks Cx unfavorable  Cervical foley placed without difficulty and inflated with 60cc fluid Buccal cytotec given Plan for Pit when foley comes out Anticipate vag del Be diligent about PPH s/s  Arabella Merles CNM 08/27/2020

## 2020-08-27 NOTE — Progress Notes (Addendum)
LABOR PROGRESS NOTE  Kathryn Mccoy is a 30 y.o. G2P1001 at [redacted]w[redacted]d admitted for postdates IOL.  Subjective: Pt endorses feeling comfortable in bed.  Objective: BP 112/63   Pulse (!) 103   Temp 98.4 F (36.9 C) (Oral)   Resp 18   Ht 5\' 2"  (1.575 m)   Wt 103 kg   LMP 11/15/2019   SpO2 99%   BMI 41.54 kg/m  or  Vitals:   08/27/20 1523 08/27/20 1631 08/27/20 1731 08/27/20 1829  BP: 119/70 100/61 123/68 112/63  Pulse: 91 (!) 101 100 (!) 103  Resp:  18 18 18   Temp:  98.4 F (36.9 C)    TempSrc:  Oral    SpO2:      Weight:      Height:        Dilation: 4 Effacement (%): 60 Cervical Position: Anterior Station: -2 Presentation: Vertex Exam by:: , RN FHT: baseline rate 135, moderate varibility, present acels, no decels Toco: quiet  Labs: Lab Results  Component Value Date   WBC 6.5 08/27/2020   HGB 11.3 (L) 08/27/2020   HCT 34.6 (L) 08/27/2020   MCV 86.1 08/27/2020   PLT 235 08/27/2020    Patient Active Problem List   Diagnosis Date Noted  . Post-dates pregnancy 08/27/2020  . Marginal insertion of umbilical cord affecting management of mother 04/09/2020  . Supervision of normal pregnancy 02/13/2020  . History of postpartum hemorrhage 02/13/2020  . History of gestational hypertension 02/13/2020    Assessment / Plan: 30 y.o. G2P1001 at [redacted]w[redacted]d here for postdates IOL.  #IOL for postdates: Cyto x2, FB out 1900. Cervical exam 4, 60, -2. Will plan to augment labor by starting pit 2x2. #Marginal cord insertion: placenta to path #Hx PPH G1: txa at delivery #Hx gHTN G1: Blood pressures post-admission all wnl. Asymptomatic. Continue to monitor. #Pain: PRN #FWB: Category 1 strip #ID: GBS negative #MOF: breast #MOC: NFP  34, MS3 08/27/2020, 8:54 PM   GME ATTESTATION:  I saw and evaluated the patient. I agree with the findings and the plan of care as documented in the medical student' note.  Duane Boston, MD OB Fellow, Faculty  [facility], Center for [facility] 08/27/2020 9:57 PM

## 2020-08-28 ENCOUNTER — Encounter (HOSPITAL_COMMUNITY): Payer: Self-pay | Admitting: Obstetrics and Gynecology

## 2020-08-28 DIAGNOSIS — Z3A41 41 weeks gestation of pregnancy: Secondary | ICD-10-CM

## 2020-08-28 DIAGNOSIS — O480 Post-term pregnancy: Secondary | ICD-10-CM

## 2020-08-28 DIAGNOSIS — O43123 Velamentous insertion of umbilical cord, third trimester: Secondary | ICD-10-CM

## 2020-08-28 LAB — RPR: RPR Ser Ql: NONREACTIVE

## 2020-08-28 MED ORDER — TETANUS-DIPHTH-ACELL PERTUSSIS 5-2.5-18.5 LF-MCG/0.5 IM SUSY: 0.5000 mL | PREFILLED_SYRINGE | Freq: Once | INTRAMUSCULAR | Status: DC

## 2020-08-28 MED ORDER — WITCH HAZEL-GLYCERIN EX PADS: 1.0000 "application " | MEDICATED_PAD | CUTANEOUS | Status: DC | PRN

## 2020-08-28 MED ORDER — TRANEXAMIC ACID-NACL 1000-0.7 MG/100ML-% IV SOLN
INTRAVENOUS | Status: AC
  Administered 2020-08-28: 1000 mg
  Filled 2020-08-28: qty 100

## 2020-08-28 MED ORDER — BENZOCAINE-MENTHOL 20-0.5 % EX AERO
1.0000 "application " | INHALATION_SPRAY | CUTANEOUS | Status: DC | PRN
  Administered 2020-08-28: 1 via TOPICAL
  Filled 2020-08-28 (×2): qty 56

## 2020-08-28 MED ORDER — SIMETHICONE 80 MG PO CHEW: 80.0000 mg | CHEWABLE_TABLET | ORAL | Status: DC | PRN

## 2020-08-28 MED ORDER — ONDANSETRON HCL 4 MG PO TABS: 4.0000 mg | ORAL_TABLET | ORAL | Status: DC | PRN

## 2020-08-28 MED ORDER — TRANEXAMIC ACID-NACL 1000-0.7 MG/100ML-% IV SOLN: 1000.0000 mg | INTRAVENOUS | Status: AC

## 2020-08-28 MED ORDER — MEASLES, MUMPS & RUBELLA VAC IJ SOLR: 0.5000 mL | Freq: Once | INTRAMUSCULAR | Status: DC

## 2020-08-28 MED ORDER — DIBUCAINE (PERIANAL) 1 % EX OINT: 1.0000 "application " | TOPICAL_OINTMENT | CUTANEOUS | Status: DC | PRN

## 2020-08-28 MED ORDER — SENNOSIDES-DOCUSATE SODIUM 8.6-50 MG PO TABS
2.0000 | ORAL_TABLET | ORAL | Status: DC
  Administered 2020-08-28 – 2020-08-29 (×2): 2 via ORAL
  Filled 2020-08-28 (×2): qty 2

## 2020-08-28 MED ORDER — CEFAZOLIN SODIUM-DEXTROSE 2-4 GM/100ML-% IV SOLN
2.0000 g | Freq: Once | INTRAVENOUS | Status: AC
  Administered 2020-08-28: 2 g via INTRAVENOUS
  Filled 2020-08-28: qty 100

## 2020-08-28 MED ORDER — DIPHENHYDRAMINE HCL 25 MG PO CAPS: 25.0000 mg | ORAL_CAPSULE | Freq: Four times a day (QID) | ORAL | Status: DC | PRN

## 2020-08-28 MED ORDER — COCONUT OIL OIL: 1.0000 "application " | TOPICAL_OIL | Status: DC | PRN

## 2020-08-28 MED ORDER — IBUPROFEN 600 MG PO TABS
600.0000 mg | ORAL_TABLET | Freq: Four times a day (QID) | ORAL | Status: DC
  Administered 2020-08-28 – 2020-08-30 (×7): 600 mg via ORAL
  Filled 2020-08-28 (×8): qty 1

## 2020-08-28 MED ORDER — PRENATAL MULTIVITAMIN CH
1.0000 | ORAL_TABLET | Freq: Every day | ORAL | Status: DC
  Administered 2020-08-28 – 2020-08-29 (×2): 1 via ORAL
  Filled 2020-08-28 (×2): qty 1

## 2020-08-28 MED ORDER — ONDANSETRON HCL 4 MG/2ML IJ SOLN: 4.0000 mg | INTRAMUSCULAR | Status: DC | PRN

## 2020-08-28 MED ORDER — ACETAMINOPHEN 325 MG PO TABS
650.0000 mg | ORAL_TABLET | ORAL | Status: DC | PRN
  Administered 2020-08-28: 650 mg via ORAL
  Filled 2020-08-28: qty 2

## 2020-08-28 NOTE — Progress Notes (Addendum)
LABOR PROGRESS NOTE  Kathryn Mccoy is a 30 y.o. G2P1001 at [redacted]w[redacted]d admitted for postdates IOL.  Subjective: Pt is resting comfortably in bed.  Objective: BP 108/69   Pulse 95   Temp 98.4 F (36.9 C) (Oral)   Resp 18   Ht 5\' 2"  (1.575 m)   Wt 103 kg   LMP 11/15/2019   SpO2 99%   BMI 41.54 kg/m  or  Vitals:   08/28/20 0100 08/28/20 0130 08/28/20 0200 08/28/20 0230  BP: (!) 111/54 110/70 106/64 108/69  Pulse: 89 90 95 95  Resp:      Temp:      TempSrc:      SpO2:      Weight:      Height:       Dilation: 7 Effacement (%): 90 Cervical Position: Anterior Station: -1 Presentation: Vertex Exam by:: Dr. [email or web address]: baseline rate 125, moderate varibility, acels present, variable decels Toco: contractions q1-2 mins  Labs: Lab Results  Component Value Date   WBC 6.5 08/27/2020   HGB 11.3 (L) 08/27/2020   HCT 34.6 (L) 08/27/2020   MCV 86.1 08/27/2020   PLT 235 08/27/2020    Patient Active Problem List   Diagnosis Date Noted  . Post-dates pregnancy 08/27/2020  . Rubella non-immune status, antepartum 08/27/2020  . Marginal insertion of umbilical cord affecting management of mother 04/09/2020  . Supervision of normal pregnancy 02/13/2020  . History of postpartum hemorrhage 02/13/2020  . History of gestational hypertension 02/13/2020    Assessment / Plan: 30 y.o. G2P1001 at [redacted]w[redacted]d here for postdates IOL.  #IOL for postdates: Cyto x2, FB out 1900. Started pit at 2220, 44mu/min at 0100. Cervical exam 7/60/-2 and AROM at 0230 with clear fluid. #Marginal cord insertion: placenta to path #Hx PPH G1: txa at delivery #Hx gHTN G1: Blood pressures post-admission all wnl. Asymptomatic. Continue to monitor. #Pain: PRN #FWB: Category 1 strip #ID: GBS negative #MOF: breast #MOC: NFP  9m, MS3 08/28/2020, 2:44 AM   GME ATTESTATION:  I saw and evaluated the patient. I agree with the findings and the plan of care as documented in the medical student's  note.  08/30/2020, MD OB Fellow, Faculty [facility], Center for [facility] 08/28/2020 3:15 AM

## 2020-08-28 NOTE — Discharge Summary (Signed)
Postpartum Discharge Summary       Patient Name: Kathryn Mccoy DOB: December 08, 1990 MRN: [id number]  Date of admission: 08/27/2020 Delivery date:08/28/2020  Delivering provider: Arrie Senate  Date of discharge: 08/30/2020  Admitting diagnosis: Post-dates pregnancy [O48.0] Intrauterine pregnancy: [redacted]w[redacted]d    Secondary diagnosis:  Active Problems:   Supervision of normal pregnancy   History of postpartum hemorrhage   History of gestational hypertension   Marginal insertion of umbilical cord affecting management of mother   Post-dates pregnancy   Rubella non-immune status, antepartum   Vaginal delivery  Additional problems: none   Discharge diagnosis: Term Pregnancy Delivered                                              Post partum procedures:none Augmentation: AROM, Pitocin, Cytotec and IP Foley Complications: None  Hospital course: Induction of Labor With Vaginal Delivery   30y.o. yo G2P1001 at 472w0das admitted to the hospital 08/27/2020 for induction of labor.  Indication for induction: Postdates.  Patient had an uncomplicated labor course as follows: Membrane Rupture Time/Date: 2:30 AM ,08/28/2020   Delivery Method:Vaginal, Spontaneous  Episiotomy: None  Lacerations:  Sulcus  Details of delivery can be found in separate delivery note.  Patient had a routine postpartum course. Patient is discharged home 08/30/20.  Newborn Data: Birth date:08/28/2020  Birth time:5:32 AM  Gender:Female  Living status:Living  Apgars:1 ,3  Weight:3330 g   Magnesium Sulfate received: No BMZ received: No Rhophylac:N/A MMR: MMR prior to dc T-DaP:Given prenatally Flu: No Transfusion:No  Physical exam  Vitals:   08/29/20 1934 08/29/20 1935 08/30/20 0011 08/30/20 0434  BP: 128/74  123/61 131/79  Pulse: 92  91 92  Resp: '18  18 18  ' Temp: 98.1 F (36.7 C)  98.1 F (36.7 C) 98 F (36.7 C)  TempSrc: Oral  Oral Oral  SpO2: 99% 99% 100% 100%  Weight:      Height:       General:  alert, cooperative and no distress Lochia: appropriate Uterine Fundus: firm DVT Evaluation: No evidence of DVT seen on physical exam. No cords or calf tenderness. Labs: Lab Results  Component Value Date   WBC 6.5 08/27/2020   HGB 11.3 (L) 08/27/2020   HCT 34.6 (L) 08/27/2020   MCV 86.1 08/27/2020   PLT 235 08/27/2020   CMP Latest Ref Rng & Units 02/13/2020  Glucose 65 - 99 mg/dL 82  BUN 7 - 25 mg/dL 6(L)  Creatinine 0.50 - 1.10 mg/dL 0.49(L)  Sodium 135 - 146 mmol/L 135  Potassium 3.5 - 5.3 mmol/L 3.7  Chloride 98 - 110 mmol/L 102  CO2 20 - 32 mmol/L 21  Calcium 8.6 - 10.2 mg/dL 9.5  Total Protein 6.1 - 8.1 g/dL 7.1  Total Bilirubin 0.2 - 1.2 mg/dL 0.3  AST 10 - 30 U/L 14  ALT 6 - 29 U/L 13   Edinburgh Score: Edinburgh Postnatal Depression Scale Screening Tool 08/29/2020  I have been able to laugh and see the funny side of things. 0  I have looked forward with enjoyment to things. 0  I have blamed myself unnecessarily when things went wrong. 0  I have been anxious or worried for no good reason. 0  I have felt scared or panicky for no good reason. 0  Things have been getting on top of me. 1  I have been so unhappy that I have had difficulty sleeping. 0  I have felt sad or miserable. 0  I have been so unhappy that I have been crying. 0  The thought of harming myself has occurred to me. 0  Edinburgh Postnatal Depression Scale Total 1     After visit meds:  Allergies as of 08/30/2020   No Known Allergies     Medication List    STOP taking these medications   Evening Primrose Oil 1000 MG Caps   famotidine 10 MG tablet Commonly known as: PEPCID     TAKE these medications   hydrocortisone cream 1 % Apply 1 application topically 2 (two) times daily.   ibuprofen 600 MG tablet Commonly known as: ADVIL Take 1 tablet (600 mg total) by mouth every 6 (six) hours.   prenatal multivitamin Tabs tablet Take 1 tablet by mouth at bedtime.        Discharge home in  stable condition Infant Feeding: Breast Infant Disposition:home with mother Discharge instruction: per After Visit Summary and Postpartum booklet. Activity: Advance as tolerated. Pelvic rest for 6 weeks.  Diet: routine diet Future Appointments: Future Appointments  Date Time Provider [place]  09/26/2020  8:15 AM Anyanwu, Sallyanne Havers, MD [facility]   Follow up Visit:  [place] at [place]. Schedule an appointment as soon as possible for a visit in 5 week(s).   Specialty: Obstetrics and Gynecology Contact information: [address] [place], [place] [phone]             Message sent to [facility] 08/28/20 by Sylvester Harder.   Please schedule this patient for a In person postpartum visit in 6 weeks with the following provider: Any provider. Additional Postpartum F/U:none  Low risk pregnancy complicated by: n/a Delivery mode:  Vaginal, Spontaneous  Anticipated Birth Control:  NFP   08/30/2020 [name], MD

## 2020-08-28 NOTE — Lactation Note (Signed)
This note was copied from a baby's chart. Lactation Consultation Note Baby less than 1 hr old. In NICU. Spoke with mom asking her if she would like to pump for stimulation since baby is in NICU. Mom asked is she not going to be able to BF. LC when baby is stable and able to feed you will. Until then to get your breast stimulated you would pump every 3 hrs as if you were feeding your baby. Mom stated OK.  Baby was rushed to NICU after compression to baby. So mom is upset and not knowing what is going on with her baby. Encouraged mom to rest and Lactation will see her later in her room. Mom stated OK.  Patient Name: Kathryn Mccoy [id number] Date: 08/28/2020 Reason for consult: L&D Initial assessment;Primapara;NICU baby;Term Age:29 hours  Maternal Data Does the patient have breastfeeding experience prior to this delivery?: No  Feeding    LATCH Score                    Lactation Tools Discussed/Used    Interventions    Discharge    Consult Status Consult Status: Follow-up Date: 08/28/20 Follow-up type: In-patient    Charyl Dancer 08/28/2020, 6:32 AM

## 2020-08-28 NOTE — Discharge Instructions (Signed)

## 2020-08-28 NOTE — Lactation Note (Signed)
This note was copied from a baby's chart. Lactation Consultation Note  Patient Name: Kathryn Mccoy [id number] Date: 08/28/2020 Reason for consult: Follow-up assessment;Term;NICU baby Age:28 hours  Visited with mom of 12 hours old FT NICU female, she's a P2 and experienced BF. Mom's goal is to exclusively BF this baby as she did with her now 78 year old baby. This visit took place in baby's room in the NICU, mom wasn't in her room on the first floor when LC attempted to visit the first time.  Spoke to NICU RN Henderson Newcomer and she reported that even though this baby is on NPO, mom has been allowed to take baby to breast and do feedings "at lib" at the breast only. Baby just finished nursing when Gi Or Norman arrived, offered a feeding assist appt for tomorrow morning and she'll happy to work again with lactation tomorrow.   Mom has only pumped twice so far. Explained to mom the importance of consistent pumping when there is infant separation for the onset of lactogenesis II. Reviewed normal newborn behavior, pumping schedule and lactogenesis II.   Feeding plan:  1. Encouraged mom to start pumping consistently every 3 hours, at least 8 times/24 hours, especially when baby is still in the NICU 2. Mom will continue doing feedings at lib at the breast as stated by NICU staff 3. NICU LC to call this mom/NICU RN tomorrow morning to coordinate feeding assist, baby doesn't have a feeding schedule yet  BF brochure, BF resources and NICU booklet were reviewed. FOB present and very supportive. Parents reported all questions and concerns were answered, they're both aware of LC OP services and will call PRN.   Maternal Data Has patient been taught Hand Expression?: Yes Does the patient have breastfeeding experience prior to this delivery?: Yes How long did the patient breastfeed?: 2 years  Feeding Mother's Current Feeding Choice: Breast Milk  LATCH Score                    Lactation Tools  Discussed/Used Tools: Pump Breast pump type: Double-Electric Breast Pump Pump Education: Setup, frequency, and cleaning Reason for Pumping: baby in NICU Pumping frequency: q 3 hours Pumped volume: 9 mL  Interventions Interventions: Breast feeding basics reviewed;Skin to skin;Breast massage;Hand express;DEBP  Discharge Pump: DEBP  Consult Status Consult Status: Follow-up Date: 08/29/20 Follow-up type: In-patient    Lonnell Chaput Venetia Constable 08/28/2020, 6:14 PM

## 2020-08-29 NOTE — Lactation Note (Signed)
This note was copied from a baby's chart. Lactation Consultation Note  Patient Name: Kathryn Mccoy [id number] Date: 08/29/2020 Reason for consult: Follow-up assessment Age:28 hours  Infant has returned to mom's room and is bf without issue. Mom with questions about tandem nursing. Reviewed cluster feeding and encouraged continued bf with cues.   Consult Status: Follow-up Follow-up type: In-patient   Elder Negus, MA IBCLC 08/29/2020, 6:28 PM

## 2020-08-29 NOTE — Progress Notes (Signed)
POSTPARTUM PROGRESS NOTE  PPD #1  Subjective:  Kathryn Mccoy is a 30 y.o. F7P1025 s/p NSVD at [redacted]w[redacted]d. Today she notes no acute complaints. She denies any problems with ambulating, voiding or po intake. Denies nausea or vomiting. She has + flatus, +BM.  Pain is well controlled.  Lochia minimal Denies fever/chills/chest pain/SOB.  no HA, no blurry vision, noRUQ pain  Objective: Blood pressure 121/66, pulse 90, temperature 97.9 F (36.6 C), temperature source Oral, resp. rate 17, height 5\' 2"  (1.575 m), weight 103 kg, last menstrual period 11/15/2019, SpO2 97 %, unknown if currently breastfeeding.  Physical Exam:  General: alert, cooperative and no distress Chest: no respiratory distress Heart:regular rate, distal pulses intact Abdomen: soft, nontender Uterine Fundus: firm, appropriately tender DVT Evaluation: No calf swelling or tenderness Extremities: no edema Skin: warm, dry  No results found for this or any previous visit (from the past 24 hour(s)).  Assessment/Plan: Kathryn Mccoy is a 30 y.o. G2P2002 s/p NSVD at [redacted]w[redacted]d PPD#1  -uncomplicated postpartum course -tolerating gen diet -pain controlled  Contraception: natural family planning Feeding: breast  Dispo: Continue with routine postpartum care, plan for discharge home tomorrow   LOS: 2 days   [redacted]w[redacted]d, DO Faculty Attending, [facility] 08/29/2020, 12:07 PM

## 2020-08-29 NOTE — Anesthesia Postprocedure Evaluation (Signed)
Anesthesia Post Note  Patient: Faria Casella  Procedure(s) Performed: AN AD HOC LABOR EPIDURAL     Patient location during evaluation: Mother Baby Anesthesia Type: Epidural Level of consciousness: awake, awake and alert and oriented Pain management: pain level controlled Vital Signs Assessment: post-procedure vital signs reviewed and stable Respiratory status: respiratory function stable and spontaneous breathing Cardiovascular status: blood pressure returned to baseline Postop Assessment: no headache, epidural receding, patient able to bend at knees, adequate PO intake, no backache, no apparent nausea or vomiting and able to ambulate Anesthetic complications: no   No complications documented.  Last Vitals:  Vitals:   08/29/20 0405 08/29/20 0900  BP: (!) 104/49 114/64  Pulse: 77 90  Resp: 16 16  Temp: 36.8 C 36.8 C  SpO2:  99%    Last Pain:  Vitals:   08/29/20 0900  TempSrc: Oral  PainSc: 0-No pain   Pain Goal: Patients Stated Pain Goal: 0 (08/28/20 1009)                 Peyton Bottoms, Bearl Mulberry

## 2020-08-30 LAB — SURGICAL PATHOLOGY

## 2020-08-30 MED ORDER — IBUPROFEN 600 MG PO TABS: 600.0000 mg | ORAL_TABLET | Freq: Four times a day (QID) | ORAL | 0 refills | Status: DC

## 2020-08-30 NOTE — Lactation Note (Addendum)
This note was copied from a baby's chart. Lactation Consultation Note  Patient Name: Kathryn Mccoy Date: 08/30/2020 Reason for consult: Follow-up assessment Age:28 hours   P2, Ex BF.  Mother's breasts are filling.  Baby cluster fed last night but last stool was more than 24 hours. Frequent voids.  Attempted to latch but baby was sleepy.  Suggest to mother to pump and give volume back to baby to help baby stool.   Mother hand expressed good flow.  Baby has had lots of short feedings during the night.  Encouraged mother to offer both breasts per feeding. Reviewed engorgement care and monitoring voids/stools.  Maternal Data Has patient been taught Hand Expression?: Yes Does the patient have breastfeeding experience prior to this delivery?: Yes  Feeding Mother's Current Feeding Choice: Breast Milk   Lactation Tools Discussed/Used Breast pump type: Double-Electric Breast Pump Reason for Pumping: Previously baby was in NICU Pumping frequency: no longer needing to pump since baby is latching    Discharge Discharge Education: Engorgement and breast care;Warning signs for feeding baby Pump: DEBP  Consult Status Consult Status: Complete Date: 08/30/20    Dahlia Byes [facility] 08/30/2020, 8:56 AM

## 2020-08-30 NOTE — Progress Notes (Signed)
Discharge instructions given to mom. Postpartum care instructions given including follow up appointment, medications, postpartum care, and warnings signs of PP HTN. No questions at this time. IV removed, awaiting newborn discharge.

## 2020-09-25 NOTE — Progress Notes (Signed)
Post Partum Visit Note  Kathryn Mccoy is a 30 y.o. G51P2002 female who presents for a postpartum visit. She is 4 weeks postpartum following a normal spontaneous vaginal delivery.  I have fully reviewed the prenatal and intrapartum course. The delivery was at 41 gestational weeks.  Anesthesia: epidural. Postpartum course has been unremarkable. Baby is doing well. Baby is feeding by breast. Bleeding thin lochia. Bowel function is normal. Bladder function is normal. Patient is not sexually active. Contraception method is none. Postpartum depression screening: negative.  The pregnancy intention screening data noted above was reviewed. Potential methods of contraception were discussed. The patient elected to proceed with FAM or LAM.    Edinburgh Postnatal Depression Scale - 09/26/20 0758      Edinburgh Postnatal Depression Scale:  In the Past 7 Days   I have been able to laugh and see the funny side of things. 0    I have looked forward with enjoyment to things. 0    I have blamed myself unnecessarily when things went wrong. 0    I have been anxious or worried for no good reason. 0    I have felt scared or panicky for no good reason. 0    Things have been getting on top of me. 0    I have been so unhappy that I have had difficulty sleeping. 1    I have felt sad or miserable. 0    The thought of harming myself has occurred to me. 0           The following portions of the patient's history were reviewed and updated as appropriate: allergies, current medications, past family history, past medical history, past social history, past surgical history and problem list.  Review of Systems Pertinent items noted in HPI and remainder of comprehensive ROS otherwise negative.  Objective:  BP 124/90   Pulse 99   Wt 202 lb (91.6 kg)   LMP 11/15/2019   BMI 36.95 kg/m    General:  alert and no distress   Breasts:  not indicated  Lungs: normal breath sounds  Heart:  Regular rhythm   Abdomen: deferred   GU exam:  not indicated       Assessment:   Normal postpartum exam.   Plan:   Essential components of care per ACOG recommendations:  1.  Mood and well being: Patient with negative depression screening today. Reviewed local resources for support.  - Patient does not use tobacco or drugs  2. Infant care and feeding:  -Patient currently breastmilk feeding? No.  Reviewed importance of draining breast regularly to support lactation. -Social determinants of health (SDOH) reviewed in EPIC. No concerns  3. Sexuality, contraception and birth spacing - Patient does not want a pregnancy in the next year.  Desired family size is 2 children.  - Reviewed forms of contraception in tiered fashion. Patient desired natural family planning (NFP) today.   - Discussed birth spacing of 18 months  4. Sleep and fatigue -Encouraged family/partner/community support of 4 hrs of uninterrupted sleep to help with mood and fatigue  5. Physical Recovery  - Discussed patients delivery - Patient had a Vaginal, no problems at delivery. Perineal healing reviewed. Patient expressed understanding - Patient has urinary incontinence? No - Patient is safe to resume physical and sexual activity  6.  Health Maintenance - HM due items addressed Yes - Last pap smear done 11/09/2019 at [facility] and was normal.  Had previous abnormal pap in 2019,  normal in 2020. Will get next one later this year. If normal, can space out to every 3 years. - Did not complete HPV vaccine series, will consider restarting this year.   Jaynie Collins, MD [facility] [employer], [facility]

## 2020-09-26 ENCOUNTER — Other Ambulatory Visit: Payer: Self-pay

## 2020-09-26 ENCOUNTER — Other Ambulatory Visit: Payer: Self-pay | Admitting: Obstetrics & Gynecology

## 2020-09-26 ENCOUNTER — Ambulatory Visit (INDEPENDENT_AMBULATORY_CARE_PROVIDER_SITE_OTHER): Payer: 59 | Admitting: Obstetrics & Gynecology

## 2020-09-26 ENCOUNTER — Encounter: Payer: Self-pay | Admitting: Obstetrics & Gynecology

## 2020-09-26 NOTE — Patient Instructions (Addendum)
Research these:  New diaphragm Caya New spermicidal lubricant Phexxi Fertility Awareness Methods   HPV (Human Papillomavirus) Vaccine: What You Need to Know 1. Why get vaccinated? HPV (human papillomavirus) vaccine can prevent infection with some types of human papillomavirus. HPV infections can cause certain types of cancers, including:  cervical, vaginal, and vulvar cancers in women  penile cancer in men  anal cancers in both men and women  cancers of tonsils, base of tongue, and back of throat (oropharyngeal cancer) in both men and women HPV infections can also cause anogenital warts. HPV vaccine can prevent over 90% of cancers caused by HPV. HPV is spread through intimate skin-to-skin or sexual contact. HPV infections are so common that nearly all people will get at least one type of HPV at some time in their lives. Most HPV infections go away on their own within 2 years. But sometimes HPV infections will last longer and can cause cancers later in life. 2. HPV vaccine HPV vaccine is routinely recommended for adolescents at 18 or 28 years of age to ensure they are protected before they are exposed to the virus. HPV vaccine may be given beginning at age 65 years and vaccination is recommended for everyone through 28 years of age. HPV vaccine may be given to adults 27 through 28 years of age, based on discussions between the patient and health care provider. Most children who get the first dose before 61 years of age need 2 doses of HPV vaccine. People who get the first dose at or after 68 years of age and younger people with certain immunocompromising conditions need 3 doses. Your health care provider can give you more information. HPV vaccine may be given at the same time as other vaccines. 3. Talk with your health care provider Tell your vaccination provider if the person getting the vaccine:  Has had an allergic reaction after a previous dose of HPV vaccine, or has any severe,  life-threatening allergies  Is pregnant--HPV vaccine is not recommended until after pregnancy In some cases, your health care provider may decide to postpone HPV vaccination until a future visit. People with minor illnesses, such as a cold, may be vaccinated. People who are moderately or severely ill should usually wait until they recover before getting HPV vaccine. Your health care provider can give you more information. 4. Risks of a vaccine reaction  Soreness, redness, or swelling where the shot is given can happen after HPV vaccination.  Fever or headache can happen after HPV vaccination. People sometimes faint after medical procedures, including vaccination. Tell your provider if you feel dizzy or have vision changes or ringing in the ears. As with any medicine, there is a very remote chance of a vaccine causing a severe allergic reaction, other serious injury, or death. 5. What if there is a serious problem? An allergic reaction could occur after the vaccinated person leaves the clinic. If you see signs of a severe allergic reaction (hives, swelling of the face and throat, difficulty breathing, a fast heartbeat, dizziness, or weakness), call 9-1-1 and get the person to the nearest hospital. For other signs that concern you, call your health care provider. Adverse reactions should be reported to the Vaccine Adverse Event Reporting System (VAERS). Your health care provider will usually file this report, or you can do it yourself. Visit the VAERS website at www.vaers.LAgents.no or call (551) 484-2581. VAERS is only for reporting reactions, and VAERS staff members do not give medical advice. 6. The National Vaccine Injury Compensation  Program The [employer] Injury Compensation Program (VICP) is a federal program that was created to compensate people who may have been injured by certain vaccines. Claims regarding alleged injury or death due to vaccination have a time limit for filing, which may  be as short as two years. Visit the VICP website at SpiritualWord.at or call 828-684-6591 to learn about the program and about filing a claim. 7. How can I learn more?  Ask your health care provider.  Call your local or state health department.  Visit the website of the Food and Drug Administration (FDA) for vaccine package inserts and additional information at FinderList.no.  Contact the Centers for Disease Control and Prevention (CDC): ? Call (661)602-1791 (1-800-CDC-INFO) or ? Visit CDC's website at PicCapture.uy. Vaccine Information Statement HPV Vaccine (01/05/2020) This information is not intended to replace advice given to you by your health care provider. Make sure you discuss any questions you have with your health care provider. Document Revised: 02/13/2020 Document Reviewed: 02/13/2020 Elsevier Patient Education  2021 [employer].

## 2020-09-27 ENCOUNTER — Encounter: Payer: Self-pay | Admitting: Obstetrics and Gynecology

## 2020-09-27 ENCOUNTER — Telehealth (INDEPENDENT_AMBULATORY_CARE_PROVIDER_SITE_OTHER): Payer: 59 | Admitting: Obstetrics and Gynecology

## 2020-09-27 DIAGNOSIS — N61 Mastitis without abscess: Secondary | ICD-10-CM | POA: Diagnosis not present

## 2020-09-27 MED ORDER — CEPHALEXIN 500 MG PO CAPS: 500.0000 mg | ORAL_CAPSULE | Freq: Four times a day (QID) | ORAL | 0 refills | Status: AC

## 2020-09-27 NOTE — Progress Notes (Signed)
   TELEHEALTH GYNECOLOGY VISIT ENCOUNTER NOTE  Provider location: [facility] [place]   Patient location: Home  I connected with Kathryn Mccoy on 09/27/20 at 11:15 AM EDT by telephone and verified that I am speaking with the correct person using two identifiers.  I discussed the limitations, risks, security and privacy concerns of performing an evaluation and management service by telephone and the availability of in person appointments. I also discussed with the patient that there may be a patient responsible charge related to this service. The patient expressed understanding and agreed to proceed.   History:  Stefany Starace is a 30 y.o. [phone] female being evaluated today for possible mastitis.  She reports fatigue, feels really tired, chills, aches, breast are tender to touch. She tried pumping and feeding. HA today. Circle of redness on right breast. No abdominal pain or odorous discharge. Symptoms all started yesterday.    Past Medical History:  Diagnosis Date  . COVID-19 affecting pregnancy, antepartum 05/07/2020   COVID in early pregnancy, patient lost 5 lbs, recovered without residual COVID symptoms Serial growth [place]  . Eczema   . Gestational hypertension   . HPV in female   . Poor fetal growth affecting management of mother in second trimester 05/07/2020   [place] on 11/30 with EFW 7%tile, normal dopplers, follow up ordered Growth on 1/13 with EFW 25%tile, weekly dopplers cancelled, growth in 4 weeks EFW 55%tile on 07/11/20  . Vaginal Pap smear, abnormal    History reviewed. No pertinent surgical history. The following portions of the patient's history were reviewed and updated as appropriate: allergies, current medications, past family history, past medical history, past social history, past surgical history and problem list.    Review of Systems:  Pertinent items noted in HPI and remainder of comprehensive ROS otherwise negative.  Physical Exam:    General:  Alert, oriented and cooperative.   Mental Status: Normal mood and affect perceived. Normal judgment and thought content.  Physical exam deferred due to nature of the encounter  Labs and Imaging No results found for this or any previous visit (from the past 336 hour(s)). No results found.    Assessment and Plan:   1. Mastitis  RX: Keflex x 10 days    I discussed the assessment and treatment plan with the patient. The patient was provided an opportunity to ask questions and all were answered. The patient agreed with the plan and demonstrated an understanding of the instructions.   The patient was advised to call back or seek an in-person evaluation/go to the ED if the symptoms worsen or if the condition fails to improve as anticipated.  I provided 8 minutes of non-face-to-face time during this encounter.   Venia Carbon, NP [facility] [employer], [facility]

## 2020-12-23 ENCOUNTER — Ambulatory Visit: Payer: 59 | Admitting: Obstetrics & Gynecology

## 2021-09-27 IMAGING — US US MFM OB FOLLOW-UP
1 series · 13 of 28 positions shown · non-contrast
Comparison: none

[Series 1: us mfm ob follow-up · 13 of 64 slices shown]
[im 3/64]
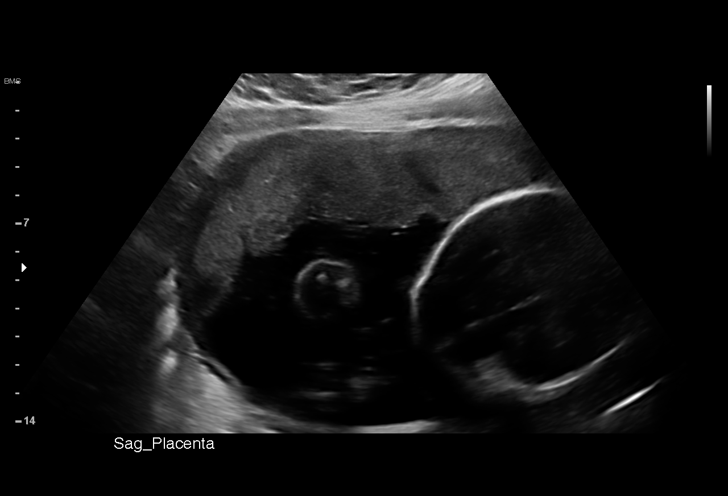
[im 8/64]
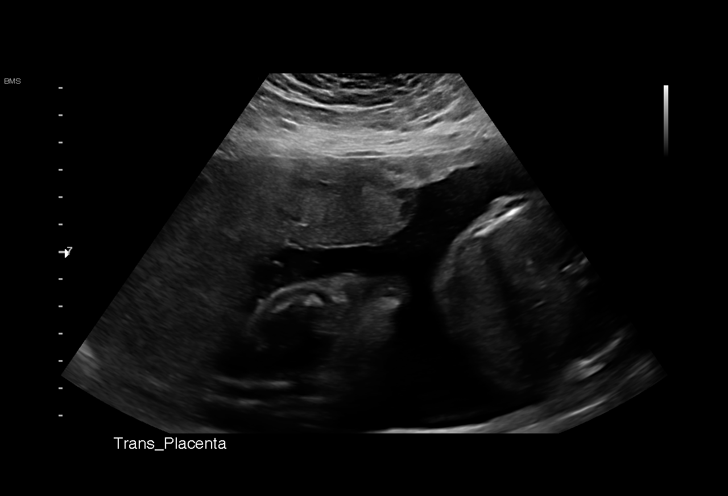
[im 12/64]
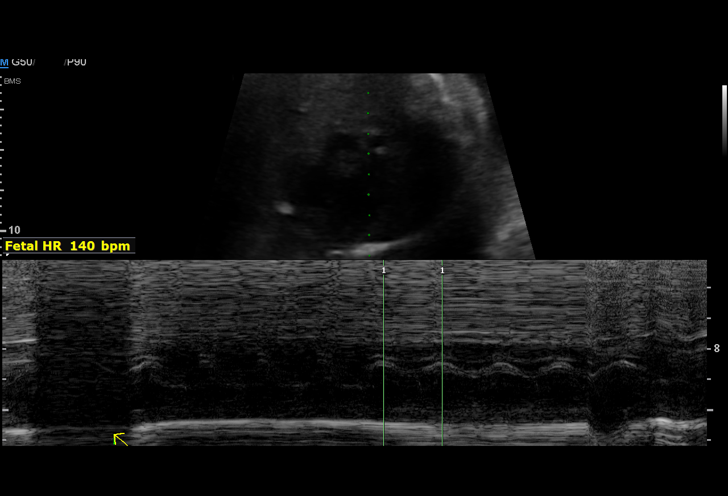
[im 17/64]
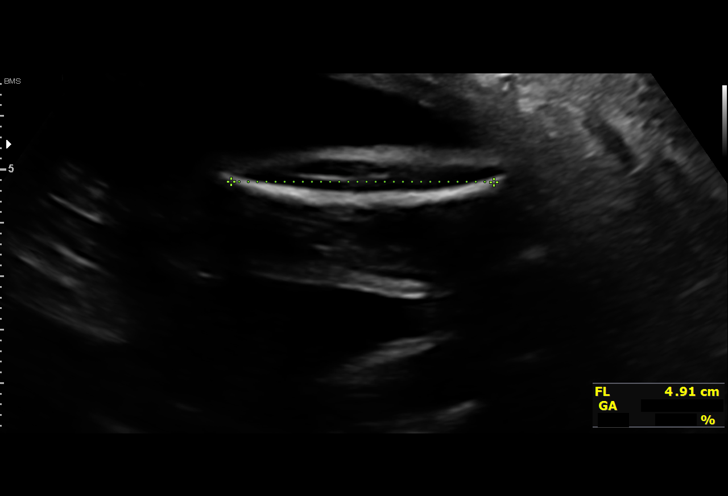
[im 22/64]
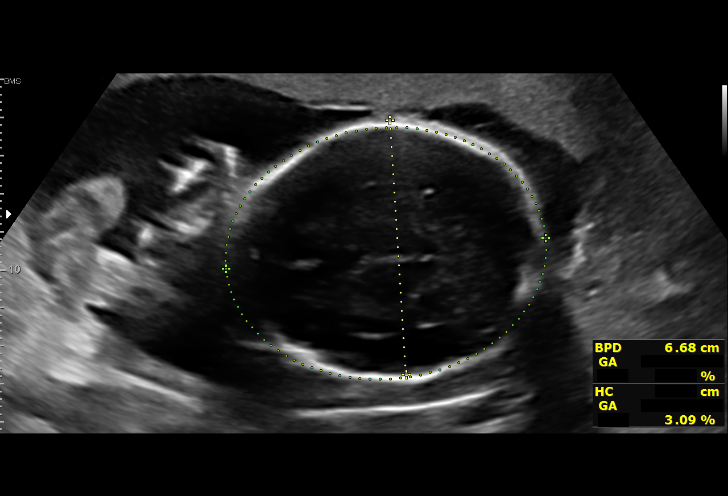
[im 26/64]
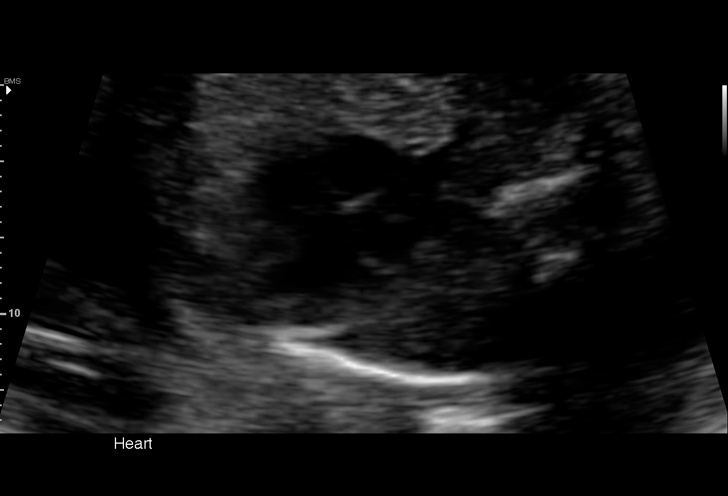
[im 33/64]
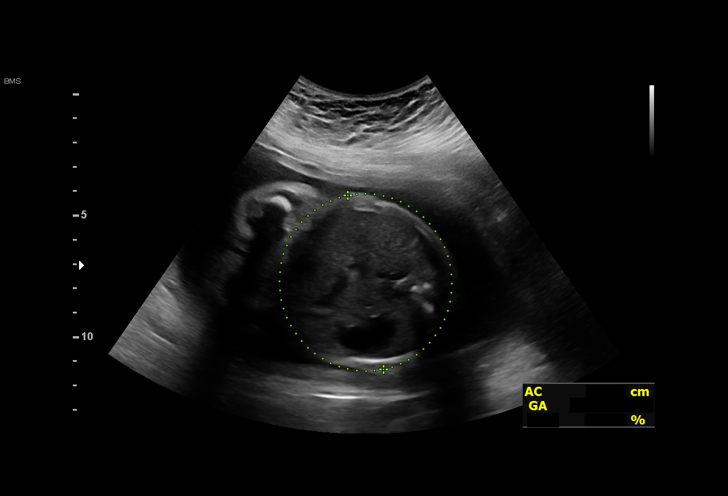
[im 38/64]
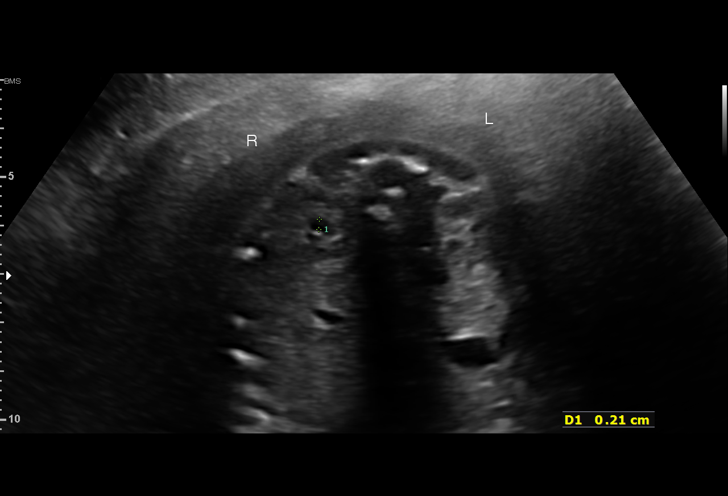
[im 43/64]
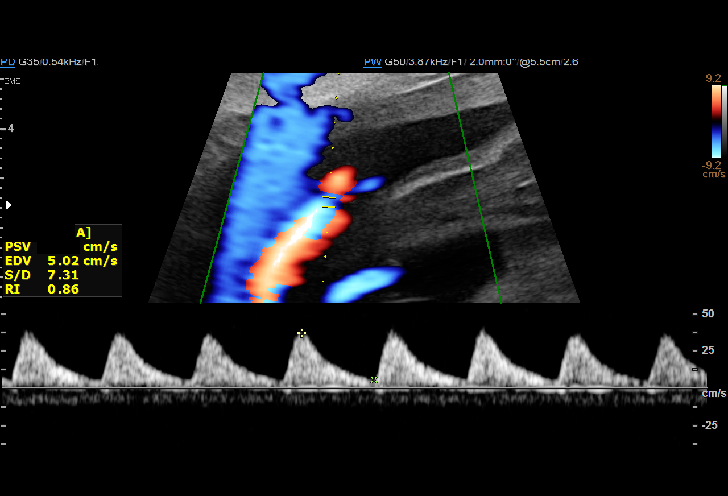
[im 47/64]
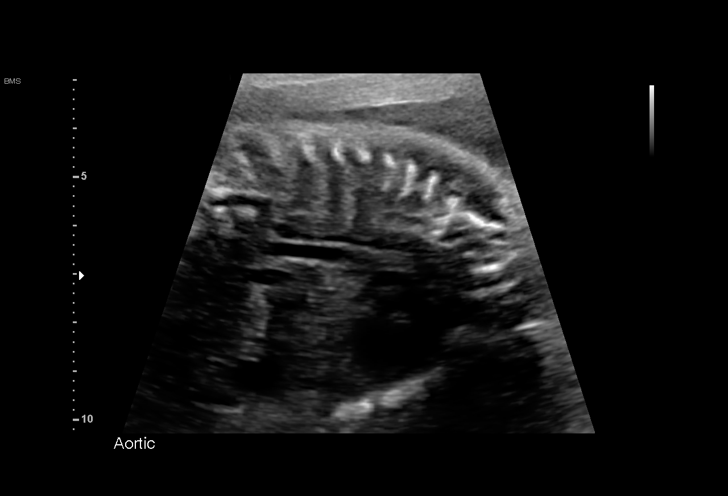
[im 52/64]
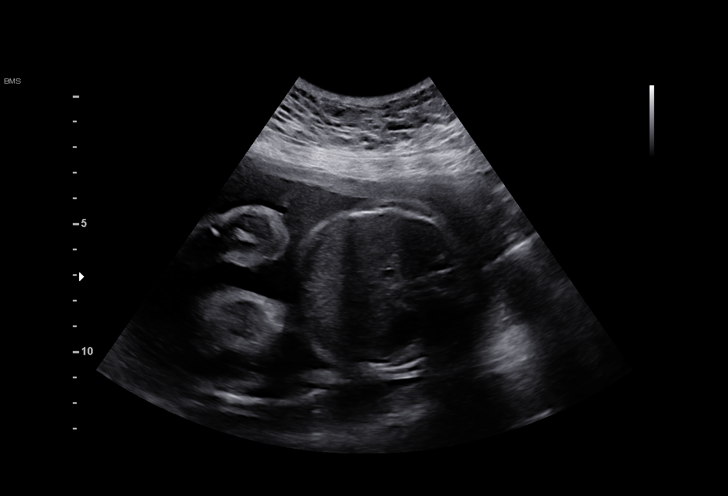
[im 57/64]
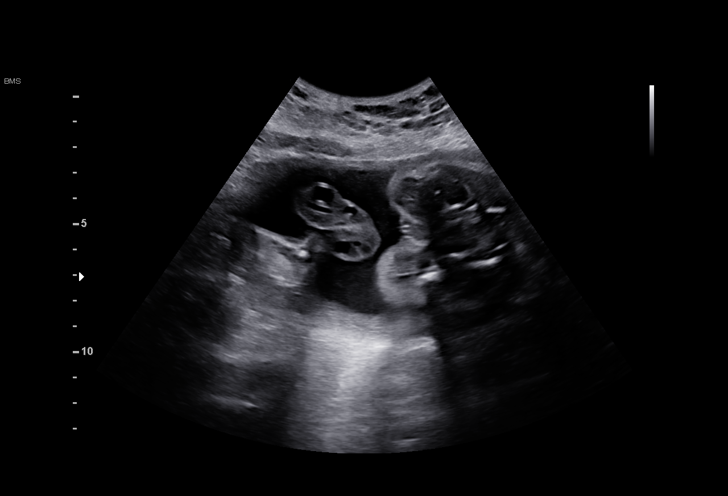
[im 61/64]
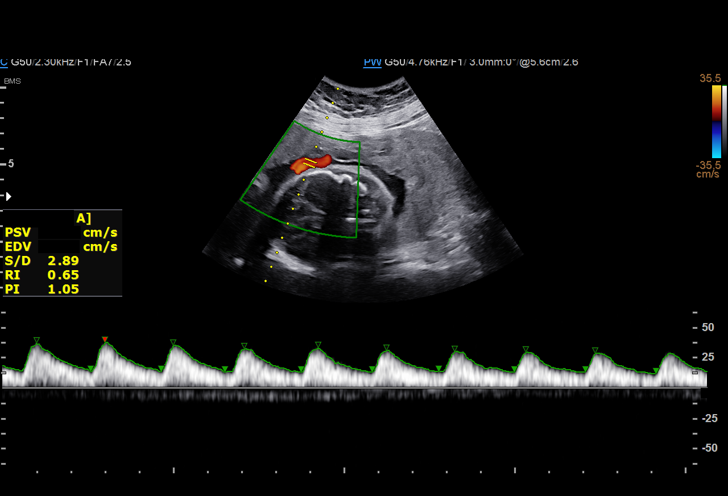

[13 of 28 positions shown; findings below may reference images not displayed]

Indications

 Obesity complicating pregnancy, second
 trimester (BMI 38)
 Poor obstetric history: Previous
 preeclampsia / eclampsia/gestational HTN
 LR NIPS
 Medical complication of pregnancy (Hx
 Postpartum Hemorrhage)
 Encounter for antenatal screening for
 malformations
 27 weeks gestation of pregnancy
Fetal Evaluation

 Num Of Fetuses:         1
 Fetal Heart Rate(bpm):  140
 Cardiac Activity:       Observed
 Presentation:           Variable
 Placenta:               Anterior Fundal
 P. Cord Insertion:      Visualized, central

 Amniotic Fluid
 AFI FV:      Within normal limits

                             Largest Pocket(cm)

Biometry

 BPD:        67  mm     G. Age:  27w 0d         38  %    CI:         79.8   %    70 - 86
                                                         FL/HC:      19.2   %    18.6 -
 HC:       237   mm     G. Age:  25w 5d        2.7  %    HC/AC:      1.06        1.05 -
 AC:      223.9  mm     G. Age:  26w 6d         35  %    FL/BPD:     68.1   %    71 - 87
 FL:       45.6  mm     G. Age:  25w 1d        2.6  %    FL/AC:      20.4   %    20 - 24

 LV:        3.8  mm
 Est. FW:     894  gm           2 lb     11  %
OB History

 Blood Type:   O+
 Gravidity:    2         Term:   1        Prem:   0        SAB:   0
 TOP:          0       Ectopic:  0        Living: 1
Gestational Age

 LMP:           27w 0d        Date:  11/15/19                 EDD:   08/21/20
 U/S Today:     26w 1d                                        EDD:   08/27/20
 Best:          27w 0d     Det. By:  LMP  (11/15/19)          EDD:   08/21/20
Anatomy

 Cranium:               Previously seen        Aortic Arch:            Not well visualized
 Cavum:                 Previously seen        Ductal Arch:            Not well visualized
 Ventricles:            Appears normal         Diaphragm:              Appears normal
 Choroid Plexus:        Previously seen        Stomach:                Appears normal, left
                                                                       sided
 Cerebellum:            Previously seen        Abdomen:                Previously seen
 Posterior Fossa:       Previously seen        Abdominal Wall:         Previously seen
 Nuchal Fold:           Not applicable (>20    Cord Vessels:           Previously seen
                        wks GA)
 Face:                  Orbits and profile     Kidneys:                Appear normal
                        previously seen
 Lips:                  Previously seen        Bladder:                Appears normal
 Thoracic:              Previously seen        Spine:                  Previously seen
 Heart:                 Not well visualized    Upper Extremities:      Previously seen
 RVOT:                  Previously seen        Lower Extremities:      Previously seen
 LVOT:                  Previously seen

 Other:  Fetus appears to be female. Heels and Right 5th digit prev visualized.
         Technically difficult due to maternal habitus and fetal position.
Doppler - Fetal Vessels

 Umbilical Artery
  S/D     %tile      RI    %tile                             ADFV    RDFV
  5.25   > 97.5    0.81       97                                No      No

Cervix Uterus Adnexa

 Cervix
 Not visualized (advanced GA >73wks)
Comments

 This patient was seen for a follow up growth scan due to
 borderline fetal growth restriction noted during her prior
 ultrasound exams.  She denies any problems since her last
 exam and reports feeling vigorous fetal movements
 throughout the day.
 On today's exam, the EFW measures at the 11th percentile
 for her gestational age.  There was normal amniotic fluid
 noted.
 Fetal movements were noted during today's exam.
 Doppler studies of the umbilical arteries showed an elevated
 S/D ratio of 5.25. There were no signs of absent or reversed
 end-diastolic flow.
 Due to borderline fetal growth restriction with an elevated S/D
 ratio, a follow-up umbilical artery Doppler study was
 scheduled in 2 weeks.  We will reassess the fetal growth
 again in 3 weeks.

 The patient understands that should fetal growth restriction
 continue to be suspected, we will follow her closely with
 weekly fetal testing and umbilical artery Doppler studies.  She
 understands and an earlier delivery may be indicated.

## 2021-10-13 IMAGING — US US MFM UA CORD DOPPLER
1 series · 14 of 28 positions shown · non-contrast
Comparison: none

[Series 1: us mfm ua cord doppler · 28 acquisitions, 14 frames shown]
[im 2/28]
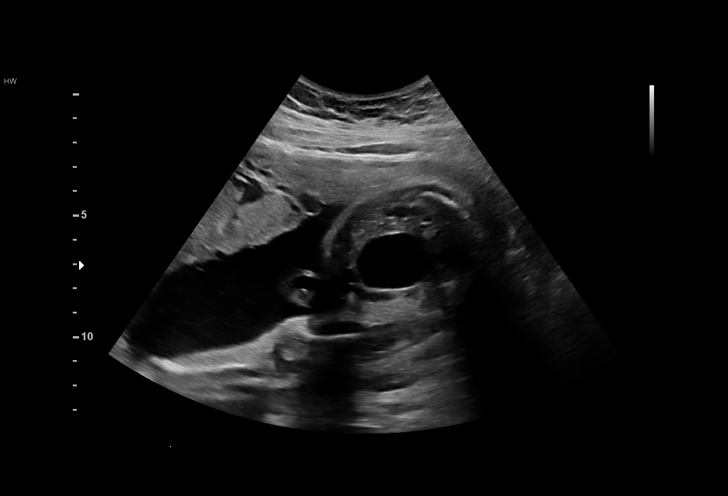
[im 4/28]
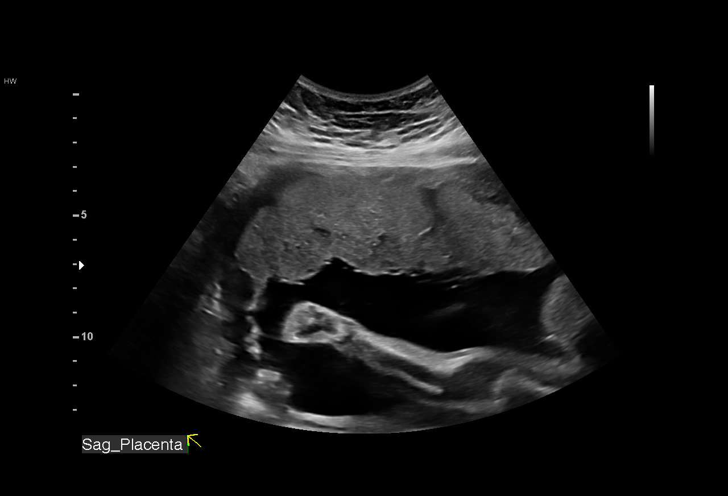
[im 6/28]
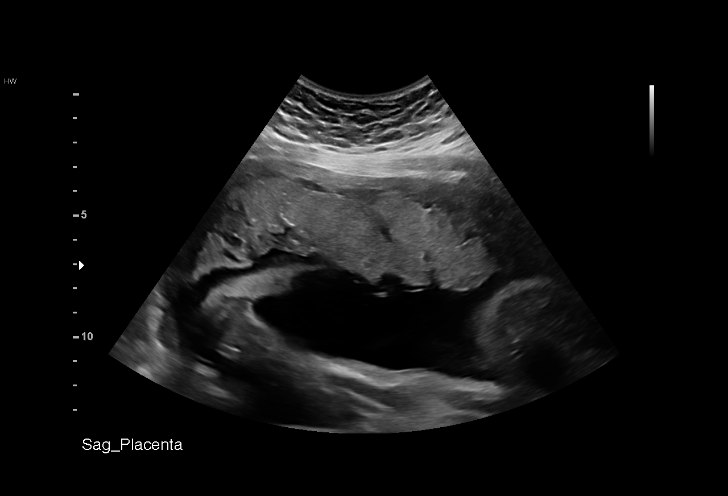
[im 8/28]
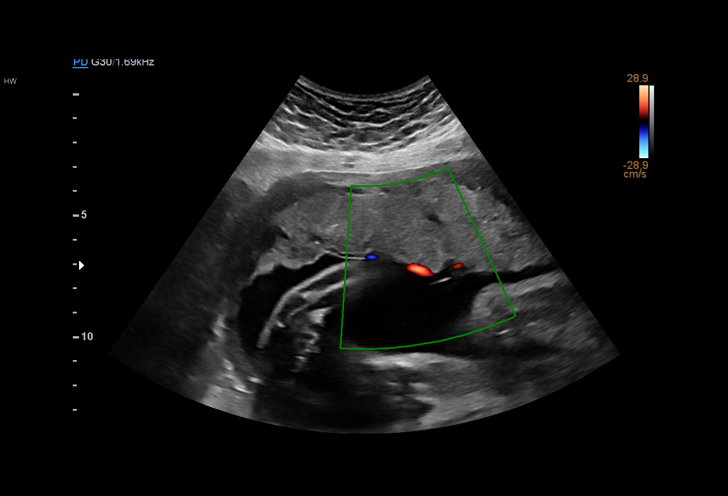
[im 10/28]
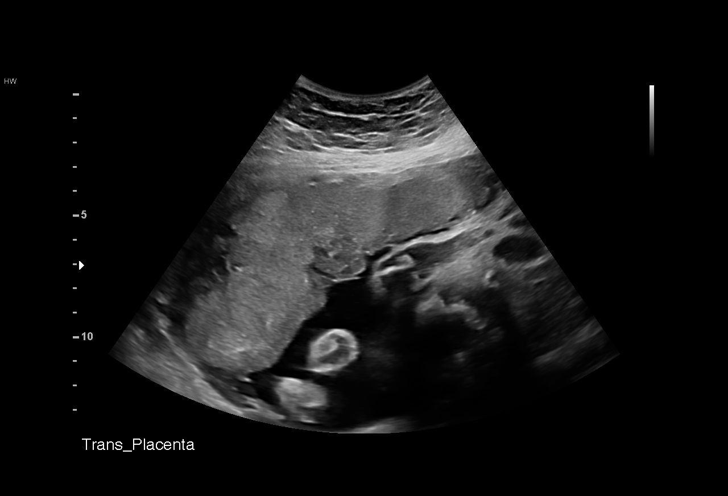
[im 12/28]
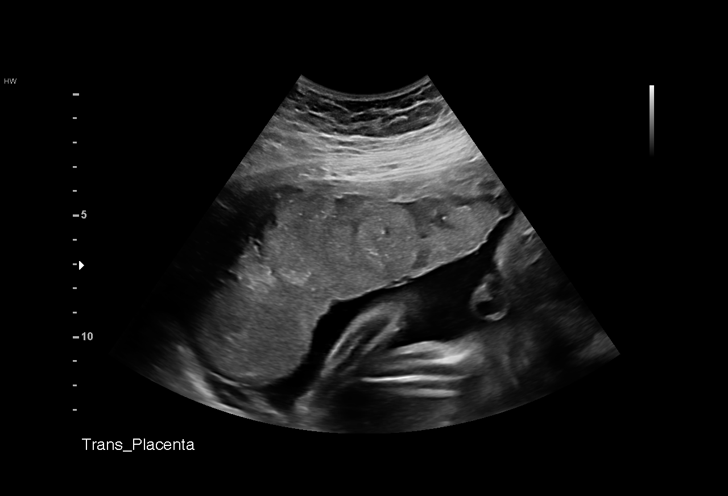
[im 14/28]
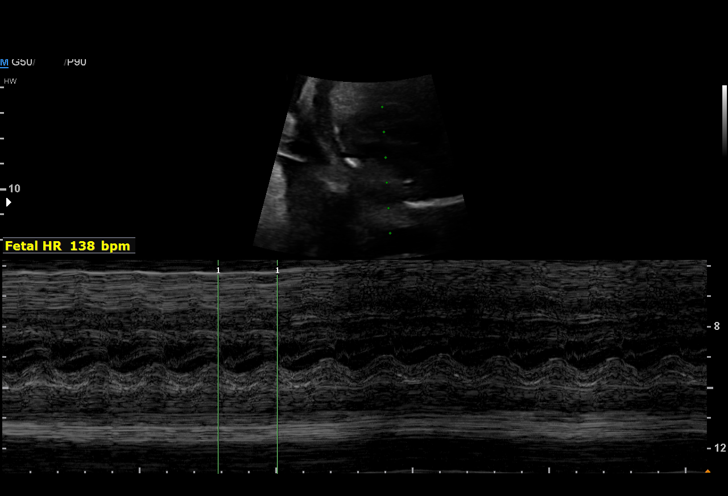
[im 16/28]
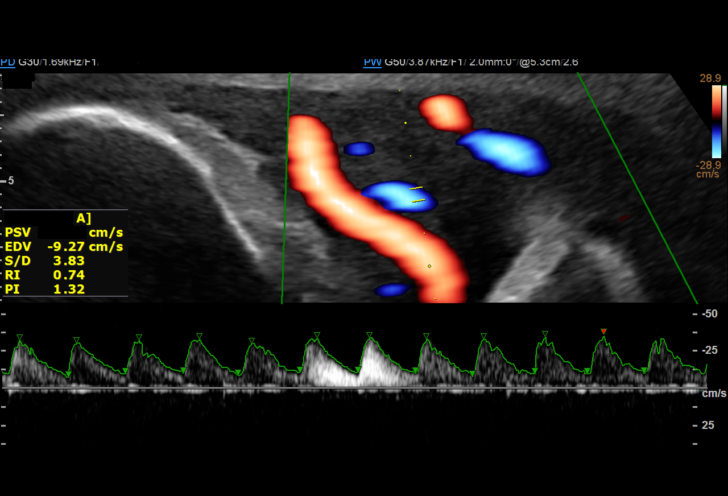
[im 18/28]
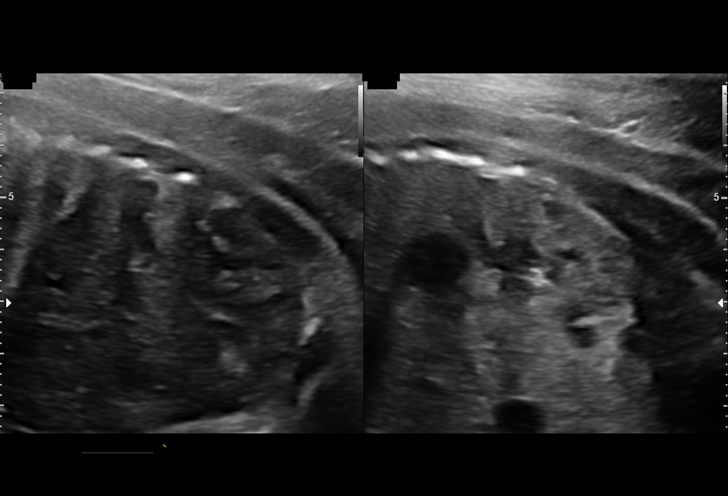
[im 20/28]
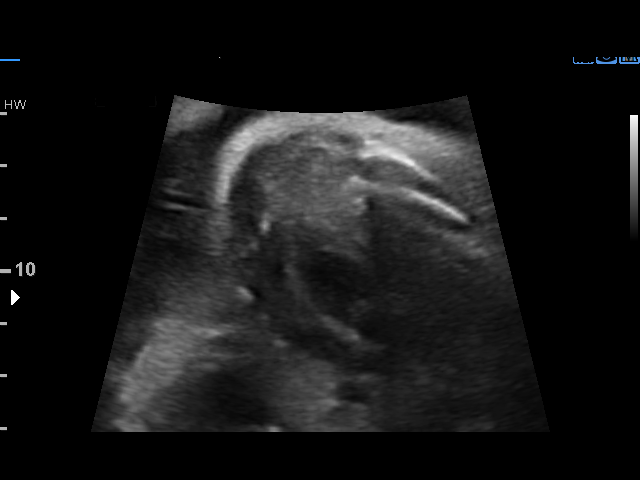
[im 22/28]
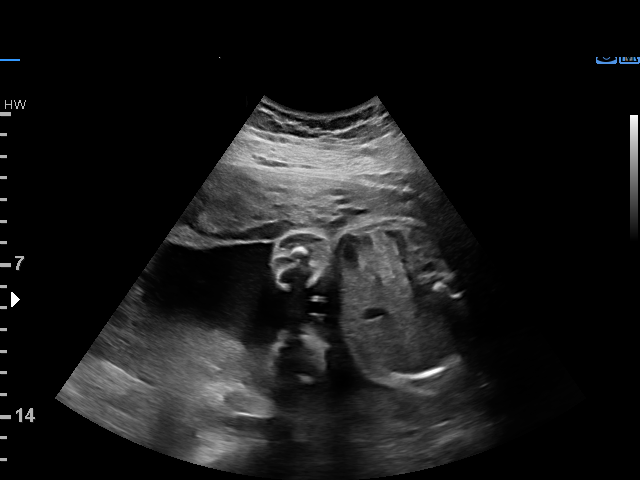
[im 24/28]
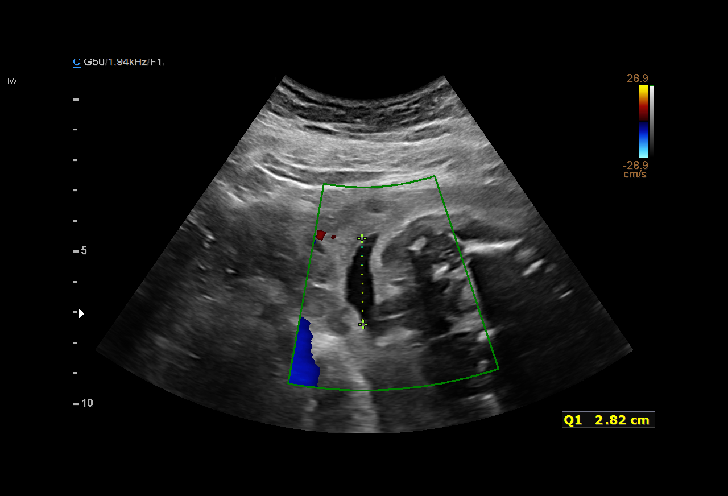
[im 26/28]
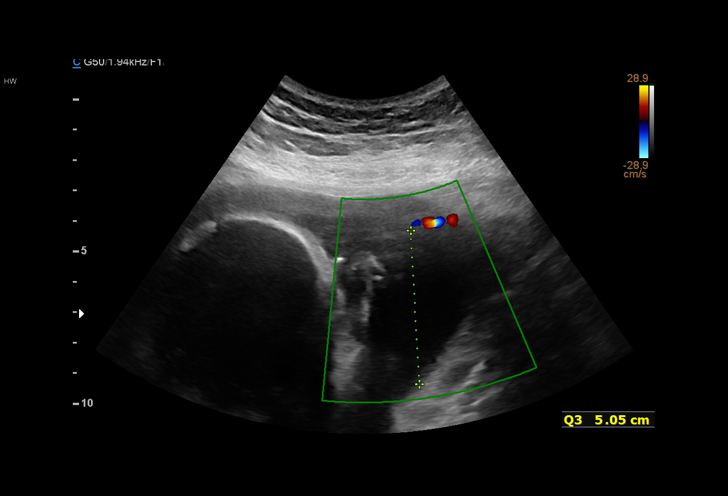
[im 28/28]
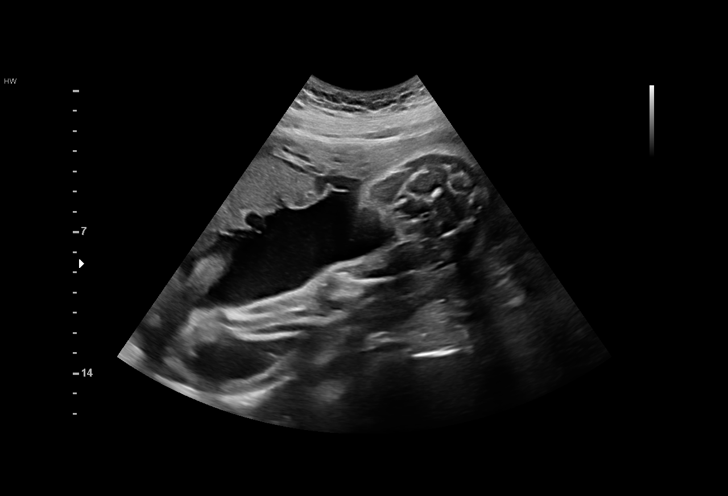

[14 of 28 positions shown; findings below may reference images not displayed]

Indications

 Maternal care for known or suspected poor
 fetal growth, third trimester, not applicable or
 unspecified IUGR
 29 weeks gestation of pregnancy
 Obesity complicating pregnancy, second
 trimester (BMI 38)
 Poor obstetric history: Previous
 preeclampsia / eclampsia/gestational HTN
 LR NIPS
 Medical complication of pregnancy (Hx
 Postpartum Hemorrhage)
 Encounter for other antenatal screening
 follow-up
Fetal Evaluation

 Num Of Fetuses:         1
 Fetal Heart Rate(bpm):  138
 Cardiac Activity:       Observed
 Presentation:           Breech
 Placenta:               Anterior
 P. Cord Insertion:      Visualized, central

 Amniotic Fluid
 AFI FV:      Within normal limits

 AFI Sum(cm)     %Tile       Largest Pocket(cm)
 13.82           44
 RUQ(cm)       RLQ(cm)       LUQ(cm)        LLQ(cm)

OB History

 Blood Type:   O+
 Gravidity:    2         Term:   1        Prem:   0        SAB:   0
 TOP:          0       Ectopic:  0        Living: 1
Gestational Age

 LMP:           29w 2d        Date:  11/15/19                 EDD:   08/21/20
 Best:          29w 2d     Det. By:  LMP  (11/15/19)          EDD:   08/21/20
Anatomy

 Thoracic:              Appears normal         Bladder:                Appears normal
 Stomach:               Appears normal, left
                        sided
Doppler - Fetal Vessels

 Umbilical Artery
  S/D     %tile      RI    %tile                             ADFV    RDFV
  4.52   > 97.5    0.78       96                                 N       N

Cervix Uterus Adnexa

 Cervix
 Not visualized (advanced GA >56wks)

 Uterus
 No abnormality visualized.

 Right Ovary
 No adnexal mass visualized.

 Left Ovary
 No adnexal mass visualized.

 Cul De Sac
 No free fluid seen.

 Adnexa
 No abnormality visualized.
Impression

 Antenatal testing due to IUGR with an EFW 11th% with an
 AC < 36th% with elevated UA Dopplers.
 Biophysical profile [DATE] with good fetal movement and
 amniotic fluid volume
 UA Dopplers are again elevated with no evidence of AEDF or
 REDF

 We plan to repeat her growth and it's possible that the FGR
 will be resolved, however, given her elevated UA dopplers I
 suspect that this may remain. If so we will continue weekly
 UA Dopplers
Recommendations

 Continue weekly testing with UA Dopplers
 Growth scheduled in 1 week

## 2021-11-16 IMAGING — US US MFM OB FOLLOW-UP
1 series · 13 of 28 positions shown · non-contrast
Comparison: none

[Series 1: us mfm ob follow-up · 13 of 43 slices shown]
[im 2/43]
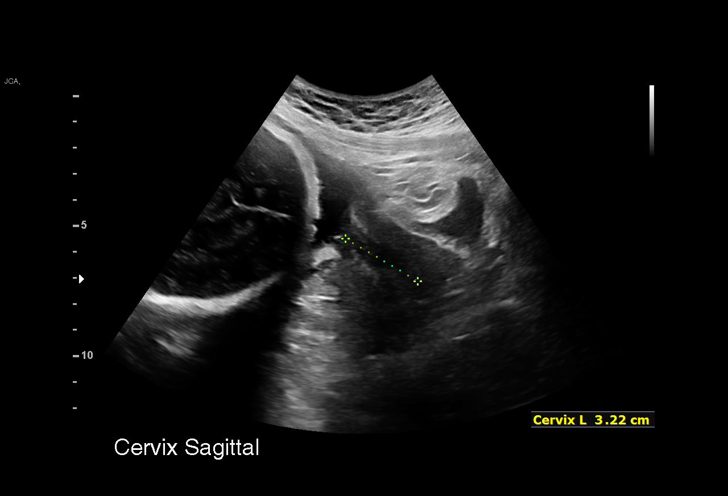
[im 5/43]
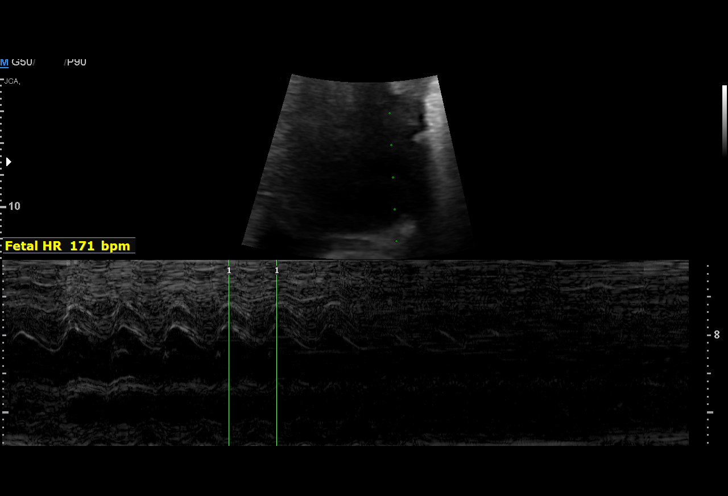
[im 8/43]
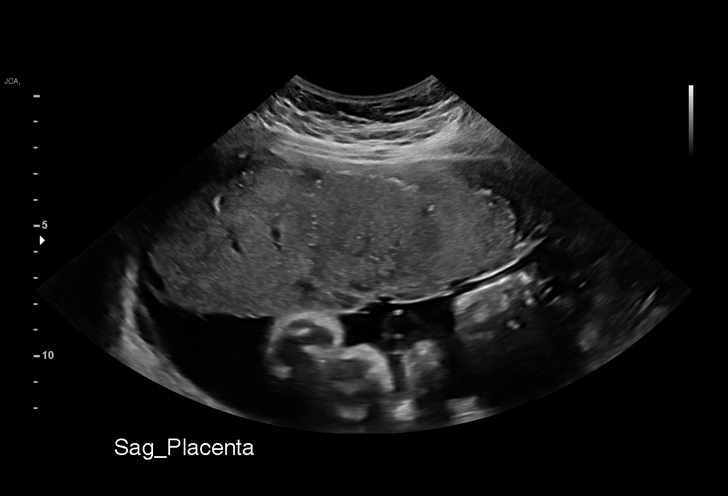
[im 11/43]
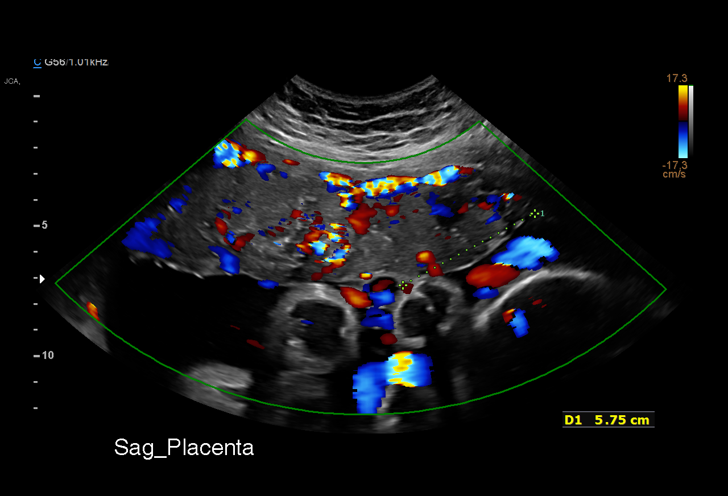
[im 15/43]
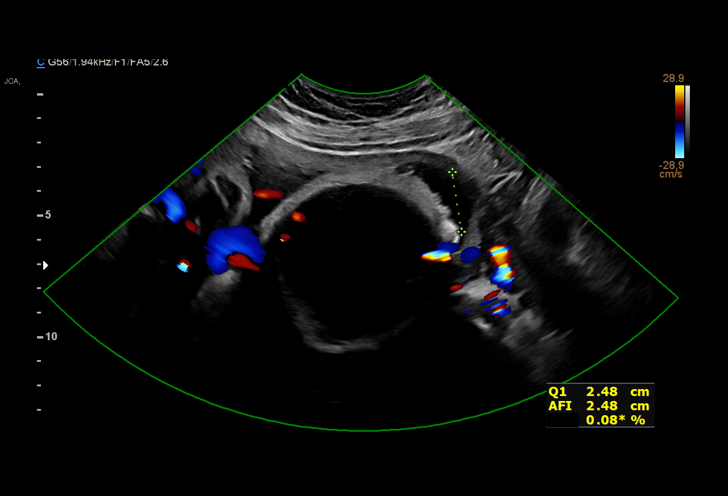
[im 18/43]
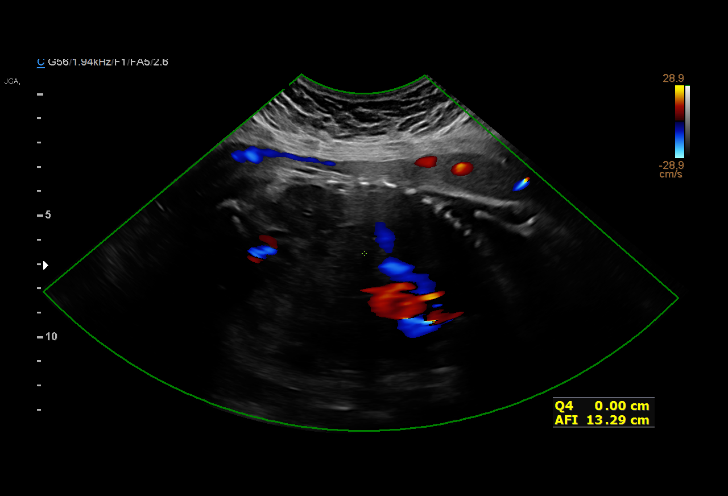
[im 22/43]
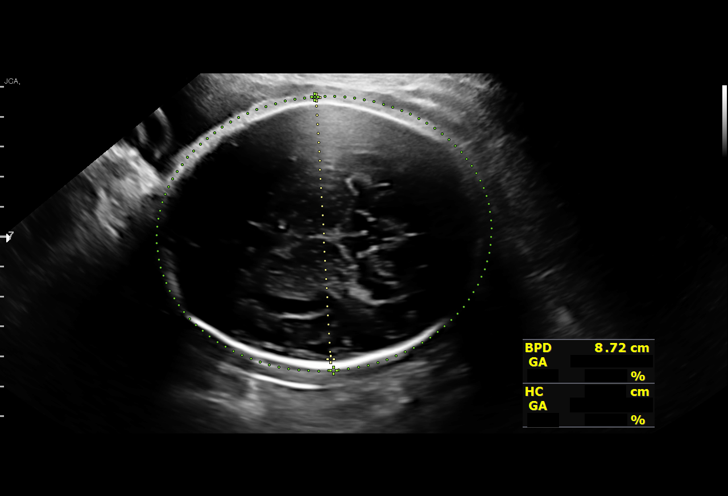
[im 25/43]
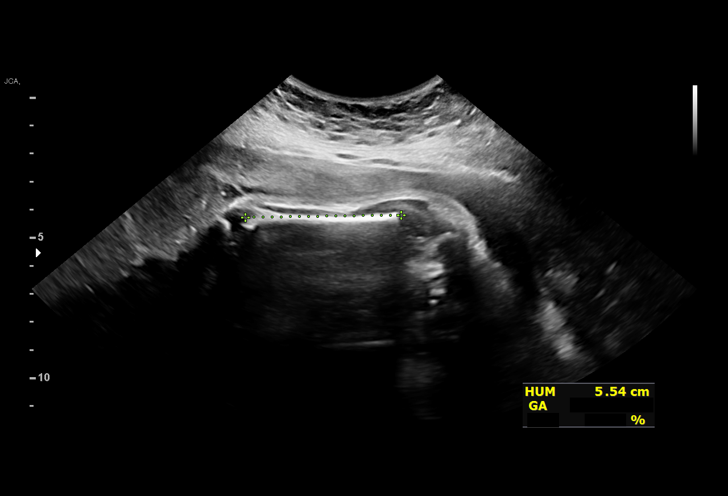
[im 29/43]
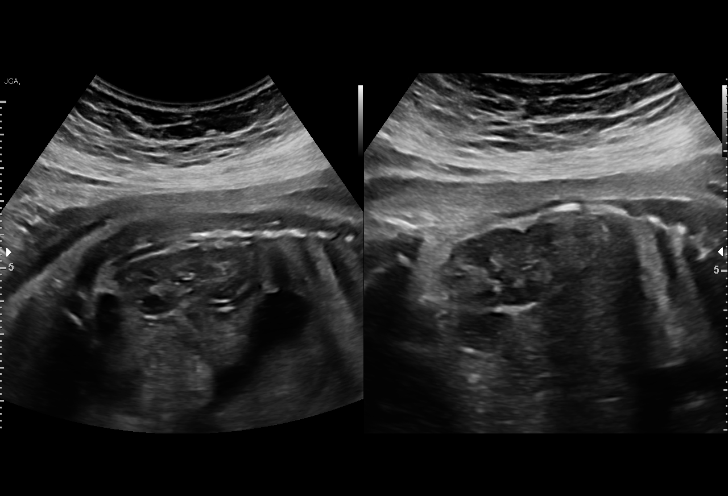
[im 32/43]
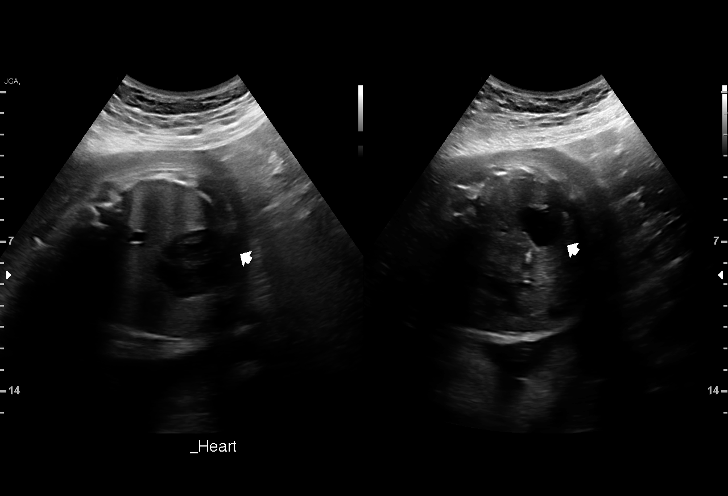
[im 35/43]
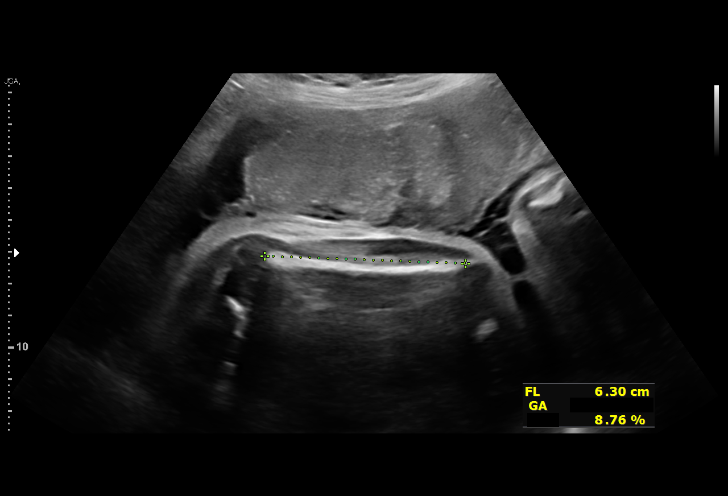
[im 38/43]
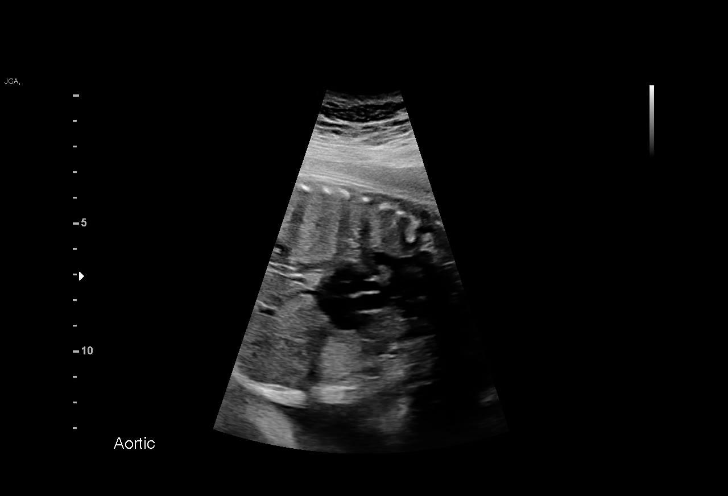
[im 41/43]
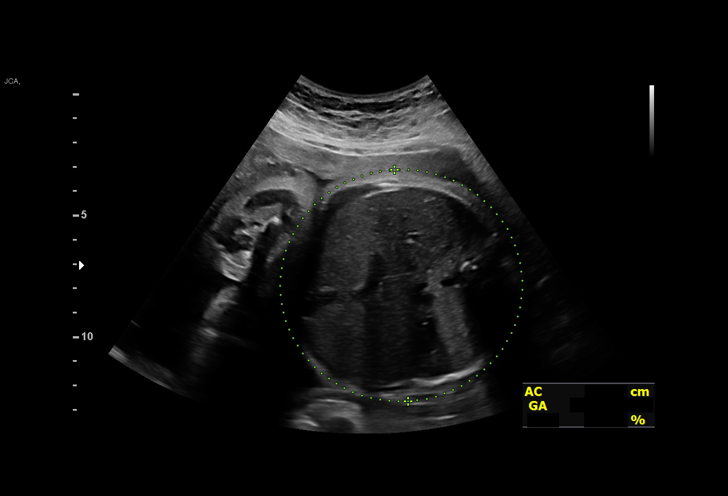

[13 of 28 positions shown; findings below may reference images not displayed]

Indications

 Poor obstetric history: Previous
 preeclampsia / eclampsia/gestational HTN
 Obesity complicating pregnancy, third
 trimester
 Medical complication of pregnancy (Hx
 Postpartum Hemorrhage)
 34 weeks gestation of pregnancy
 Antenatal follow-up for nonvisualized fetal
 anatomy
 LR NIPS
Fetal Evaluation

 Num Of Fetuses:         1
 Fetal Heart Rate(bpm):  143
 Cardiac Activity:       Observed
 Presentation:           Cephalic
 Placenta:               Anterior
 P. Cord Insertion:      Previously Visualized

 Amniotic Fluid
 AFI FV:      Within normal limits

 AFI Sum(cm)     %Tile       Largest Pocket(cm)
 13.29           43

 RUQ(cm)       RLQ(cm)       LUQ(cm)        LLQ(cm)
 2.48          0
Biometry
 BPD:      87.4  mm     G. Age:  35w 2d         80  %    CI:         75.9   %    70 - 86
                                                         FL/HC:      20.1   %    19.4 -
 HC:       318   mm     G. Age:  35w 5d         56  %    HC/AC:      1.03        0.96 -
 AC:      309.5  mm     G. Age:  34w 6d         75  %    FL/BPD:     73.1   %    71 - 87
 FL:       63.9  mm     G. Age:  33w 0d         15  %    FL/AC:      20.6   %    20 - 24
 HUM:      54.2  mm     G. Age:  31w 4d          6  %
 LV:        5.6  mm

 Est. FW:    2441  gm      5 lb 6 oz     55  %
OB History

 Blood Type:   O+
 Gravidity:    2         Term:   1        Prem:   0        SAB:   0
 TOP:          0       Ectopic:  0        Living: 1
Gestational Age

 LMP:           34w 1d        Date:  11/15/19                 EDD:   08/21/20
 U/S Today:     34w 5d                                        EDD:   08/17/20
 Best:          34w 1d     Det. By:  LMP  (11/15/19)          EDD:   08/21/20
Anatomy

 Cranium:               Previously seen        Aortic Arch:            Appears normal
 Cavum:                 Previously seen        Ductal Arch:            Not well visualized
 Ventricles:            Appears normal         Diaphragm:              Previously seen
 Choroid Plexus:        Previously seen        Stomach:                Appears normal, left
                                                                       sided
 Cerebellum:            Previously seen        Abdomen:                Previously seen
 Posterior Fossa:       Previously seen        Abdominal Wall:         Previously seen
 Nuchal Fold:           Not applicable (>20    Cord Vessels:           Previously seen
                        wks GA)
 Face:                  Orbits and profile     Kidneys:                Appear normal
                        previously seen
 Lips:                  Previously seen        Bladder:                Appears normal
 Thoracic:              Previously seen        Spine:                  Previously seen
 Heart:                 Previously seen        Upper Extremities:      Previously seen
 RVOT:                  Previously seen        Lower Extremities:      Previously seen
 LVOT:                  Previously seen

 Other:  Fetus appears to be female. Heels and Right 5th digit prev visualized.
         Technically difficult due to maternal habitus and fetal position.
Cervix Uterus Adnexa

 Cervix
 Length:           3.22  cm.
 Normal appearance by transabdominal scan.
Impression

 Maternal obesity. Patient returned for fetal growth
 assessment. She does not have gestational diabetes.
 Fetal growth restriction seen in early pregnancy has resolved.

 Fetal growth is appropriate for gestational age.  Amniotic fluid
 is normal and good fetal activity seen.  Cephalic presentation.
 I reassured the patient of the findings.
Recommendations

 Follow-up scans as clinically indicated.
                 Jeune, Tomas

## 2023-01-20 ENCOUNTER — Encounter: Payer: Self-pay | Admitting: Obstetrics and Gynecology

## 2023-01-20 DIAGNOSIS — Z3490 Encounter for supervision of normal pregnancy, unspecified, unspecified trimester: Secondary | ICD-10-CM | POA: Insufficient documentation

## 2023-01-20 NOTE — Progress Notes (Signed)
Did not show up for appt

## 2023-01-29 ENCOUNTER — Ambulatory Visit: Payer: Self-pay | Admitting: Obstetrics and Gynecology

## 2023-01-29 DIAGNOSIS — Z3490 Encounter for supervision of normal pregnancy, unspecified, unspecified trimester: Secondary | ICD-10-CM
